# Patient Record
Sex: Male | Born: 1955
Health system: Southern US, Community
[De-identification: ages and names within clinical notes are randomized; demographics above are authoritative.]

## PROBLEM LIST (undated history)

## (undated) DIAGNOSIS — S66802A Unspecified injury of other specified muscles, fascia and tendons at wrist and hand level, left hand, initial encounter: Secondary | ICD-10-CM

## (undated) DIAGNOSIS — T7840XA Allergy, unspecified, initial encounter: Secondary | ICD-10-CM

## (undated) DIAGNOSIS — N419 Inflammatory disease of prostate, unspecified: Secondary | ICD-10-CM

## (undated) DIAGNOSIS — G5600 Carpal tunnel syndrome, unspecified upper limb: Secondary | ICD-10-CM

## (undated) DIAGNOSIS — E785 Hyperlipidemia, unspecified: Secondary | ICD-10-CM

## (undated) HISTORY — PX: POLYPECTOMY: SHX149

## (undated) HISTORY — PX: CARPAL TUNNEL RELEASE: SHX101

## (undated) HISTORY — DX: Hyperlipidemia, unspecified: E78.5

## (undated) HISTORY — DX: Carpal tunnel syndrome, unspecified upper limb: G56.00

## (undated) HISTORY — PX: COLONOSCOPY: SHX174

## (undated) HISTORY — DX: Allergy, unspecified, initial encounter: T78.40XA

## (undated) HISTORY — DX: Inflammatory disease of prostate, unspecified: N41.9

---

## 1999-07-27 ENCOUNTER — Encounter: Payer: Self-pay | Admitting: Family Medicine

## 1999-07-27 ENCOUNTER — Ambulatory Visit (HOSPITAL_COMMUNITY): Admission: RE | Admit: 1999-07-27 | Discharge: 1999-07-27 | Payer: Self-pay | Admitting: Family Medicine

## 2005-12-23 ENCOUNTER — Ambulatory Visit: Payer: Self-pay | Admitting: Internal Medicine

## 2005-12-28 ENCOUNTER — Encounter (INDEPENDENT_AMBULATORY_CARE_PROVIDER_SITE_OTHER): Payer: Self-pay | Admitting: *Deleted

## 2005-12-28 ENCOUNTER — Ambulatory Visit: Payer: Self-pay | Admitting: Internal Medicine

## 2008-04-15 ENCOUNTER — Ambulatory Visit: Payer: Self-pay | Admitting: Internal Medicine

## 2008-04-15 DIAGNOSIS — Z86010 Personal history of colonic polyps: Secondary | ICD-10-CM | POA: Insufficient documentation

## 2008-04-26 ENCOUNTER — Ambulatory Visit: Payer: Self-pay | Admitting: Internal Medicine

## 2008-04-26 LAB — CONVERTED CEMR LAB
ALT: 16 units/L (ref 0–53)
AST: 15 units/L (ref 0–37)
Albumin: 3.7 g/dL (ref 3.5–5.2)
Alkaline Phosphatase: 51 units/L (ref 39–117)
BUN: 11 mg/dL (ref 6–23)
Basophils Absolute: 0 10*3/uL (ref 0.0–0.1)
Basophils Relative: 0 % (ref 0.0–3.0)
Bilirubin Urine: NEGATIVE
Bilirubin, Direct: 0.1 mg/dL (ref 0.0–0.3)
Blood in Urine, dipstick: NEGATIVE
CO2: 32 meq/L (ref 19–32)
Calcium: 8.9 mg/dL (ref 8.4–10.5)
Chloride: 101 meq/L (ref 96–112)
Cholesterol: 149 mg/dL (ref 0–200)
Creatinine, Ser: 1 mg/dL (ref 0.4–1.5)
Eosinophils Absolute: 0.2 10*3/uL (ref 0.0–0.7)
Eosinophils Relative: 2.3 % (ref 0.0–5.0)
GFR calc Af Amer: 101 mL/min
GFR calc non Af Amer: 83 mL/min
Glucose, Bld: 78 mg/dL (ref 70–99)
Glucose, Urine, Semiquant: NEGATIVE
HCT: 41.8 % (ref 39.0–52.0)
HDL: 56.6 mg/dL (ref >39.0)
Hemoglobin: 14.2 g/dL (ref 13.0–17.0)
Ketones, urine, test strip: NEGATIVE
LDL Cholesterol: 82 mg/dL (ref 0–99)
Lymphocytes Relative: 12.2 % (ref 12.0–46.0)
MCHC: 34 g/dL (ref 30.0–36.0)
MCV: 94 fL (ref 78.0–100.0)
Monocytes Absolute: 1 10*3/uL (ref 0.1–1.0)
Monocytes Relative: 11.7 % (ref 3.0–12.0)
Neutro Abs: 6.2 10*3/uL (ref 1.4–7.7)
Neutrophils Relative %: 73.8 % (ref 43.0–77.0)
Nitrite: NEGATIVE
PSA: 0.41 ng/mL (ref 0.10–4.00)
Platelets: 202 10*3/uL (ref 150–400)
Potassium: 3.7 meq/L (ref 3.5–5.1)
Protein, U semiquant: NEGATIVE
RBC: 4.45 M/uL (ref 4.22–5.81)
RDW: 12.1 % (ref 11.5–14.6)
Sodium: 137 meq/L (ref 135–145)
Specific Gravity, Urine: 1.01
TSH: 0.97 microintl units/mL (ref 0.35–5.50)
Total Bilirubin: 0.7 mg/dL (ref 0.3–1.2)
Total CHOL/HDL Ratio: 2.6
Total Protein: 6.3 g/dL (ref 6.0–8.3)
Triglycerides: 51 mg/dL (ref 0–149)
Urobilinogen, UA: 0.2
VLDL: 10 mg/dL (ref 0–40)
WBC Urine, dipstick: NEGATIVE
WBC: 8.4 10*3/uL (ref 4.5–10.5)
pH: 7

## 2009-03-05 ENCOUNTER — Ambulatory Visit: Payer: Self-pay | Admitting: Internal Medicine

## 2009-03-05 DIAGNOSIS — H6120 Impacted cerumen, unspecified ear: Secondary | ICD-10-CM | POA: Insufficient documentation

## 2009-06-24 ENCOUNTER — Telehealth: Payer: Self-pay | Admitting: Internal Medicine

## 2009-06-26 ENCOUNTER — Telehealth: Payer: Self-pay | Admitting: Internal Medicine

## 2009-10-28 ENCOUNTER — Telehealth: Payer: Self-pay | Admitting: Internal Medicine

## 2010-02-19 ENCOUNTER — Ambulatory Visit: Payer: Self-pay | Admitting: Internal Medicine

## 2010-02-19 LAB — CONVERTED CEMR LAB
ALT: 13 units/L (ref 0–53)
AST: 17 units/L (ref 0–37)
Albumin: 3.9 g/dL (ref 3.5–5.2)
Alkaline Phosphatase: 53 units/L (ref 39–117)
BUN: 16 mg/dL (ref 6–23)
Basophils Absolute: 0 10*3/uL (ref 0.0–0.1)
Basophils Relative: 0.6 % (ref 0.0–3.0)
Bilirubin Urine: NEGATIVE
Bilirubin, Direct: 0.1 mg/dL (ref 0.0–0.3)
Blood in Urine, dipstick: NEGATIVE
CO2: 27 meq/L (ref 19–32)
Calcium: 9.3 mg/dL (ref 8.4–10.5)
Chloride: 104 meq/L (ref 96–112)
Cholesterol: 172 mg/dL (ref 0–200)
Creatinine, Ser: 1 mg/dL (ref 0.4–1.5)
Eosinophils Absolute: 0.1 10*3/uL (ref 0.0–0.7)
Eosinophils Relative: 2 % (ref 0.0–5.0)
GFR calc non Af Amer: 87.63 mL/min (ref >60)
Glucose, Bld: 98 mg/dL (ref 70–99)
Glucose, Urine, Semiquant: NEGATIVE
HCT: 44.9 % (ref 39.0–52.0)
HDL: 59 mg/dL (ref >39.00)
Hemoglobin: 15.2 g/dL (ref 13.0–17.0)
Ketones, urine, test strip: NEGATIVE
LDL Cholesterol: 99 mg/dL (ref 0–99)
Lymphocytes Relative: 22.5 % (ref 12.0–46.0)
Lymphs Abs: 1.3 10*3/uL (ref 0.7–4.0)
MCHC: 33.9 g/dL (ref 30.0–36.0)
MCV: 93.4 fL (ref 78.0–100.0)
Monocytes Absolute: 0.6 10*3/uL (ref 0.1–1.0)
Monocytes Relative: 9.8 % (ref 3.0–12.0)
Neutro Abs: 3.9 10*3/uL (ref 1.4–7.7)
Neutrophils Relative %: 65.1 % (ref 43.0–77.0)
Nitrite: NEGATIVE
PSA: 0.47 ng/mL (ref 0.10–4.00)
Platelets: 248 10*3/uL (ref 150.0–400.0)
Potassium: 4 meq/L (ref 3.5–5.1)
Protein, U semiquant: NEGATIVE
RBC: 4.81 M/uL (ref 4.22–5.81)
RDW: 13.7 % (ref 11.5–14.6)
Sodium: 138 meq/L (ref 135–145)
Specific Gravity, Urine: 1.02
TSH: 0.97 microintl units/mL (ref 0.35–5.50)
Total Bilirubin: 0.8 mg/dL (ref 0.3–1.2)
Total CHOL/HDL Ratio: 3
Total Protein: 6.5 g/dL (ref 6.0–8.3)
Triglycerides: 70 mg/dL (ref 0.0–149.0)
Urobilinogen, UA: 0.2
VLDL: 14 mg/dL (ref 0.0–40.0)
WBC Urine, dipstick: NEGATIVE
WBC: 6 10*3/uL (ref 4.5–10.5)
pH: 6

## 2010-02-23 ENCOUNTER — Ambulatory Visit: Payer: Self-pay | Admitting: Internal Medicine

## 2010-03-31 ENCOUNTER — Ambulatory Visit: Payer: Self-pay | Admitting: Internal Medicine

## 2010-06-09 NOTE — Progress Notes (Signed)
Summary: Pt called and gave the malaria vaccine info that was needed  Phone Note Call from Patient Call back at Home Phone [phone]   Caller: spouse-[name] Summary of Call: Pts wife called health dept and was told that the kind of malaria vaccine pt needs is Chloroquin. Pt weighs 165lbs and is traveling to [place] from March 10th thru the 14th 2011.  [facility] phone [phone] and fax [phone] Initial call taken by: Lucy Antigua,  June 26, 2009 9:05 AM    chlorquine 500 mg  #11 one weekly starting 2 weeks prior to travel    left msg on ans mach that med had been called in to pharm . KIK

## 2010-06-09 NOTE — Progress Notes (Signed)
Summary: REQ FOR MED  Phone Note Call from Patient   Caller: Spouse Alvino Chapel) [phone] Reason for Call: Talk to Doctor Summary of Call: Pts wife called to adv that her husband will be going out of the country ([place]) from March 10 - July 21, 2009.... Pt req that a RX for med: Chloroquine (med for prevention of Malaria) be sent [facility]  Phone # [phone]   /    Fax # [phone].  Pts wife stated that if there is another /  different prevention med for Malaria that Dr Kirtland Bouchard would recommend please let them know..... Pt / Pts wife can be reached at [phone].  Initial call taken by: Debbra Riding,  June 24, 2009 12:28 PM  Follow-up for Phone Call        have patient check with Public Health for malaria proprylaxis guidelines Follow-up by: Gordy Savers  MD,  June 24, 2009 1:01 PM  Additional Follow-up for Phone Call Additional follow up Details #1::        Spoke with pts wife Alvino Chapel).... she adv that she would talk to the Health Dept and call back to advise. Additional Follow-up by: Debbra Riding,  June 24, 2009 1:37 PM

## 2010-06-09 NOTE — Assessment & Plan Note (Signed)
Summary: ear wax build up/cjr   Vital Signs:  Patient profile:   55 year old male Weight:      167 pounds BP sitting:   120 / 70  (right arm) Cuff size:   regular  Vitals Entered By: Duard Brady LPN (March 31, 2010 3:21 PM) CC: c/o (L) ear plugged with wax Is Patient Diabetic? No   CC:  c/o (L) ear plugged with wax.  History of Present Illness: 55 year old patient complained of decrease in auditory acuity from the left ear.  He was noted to have a cerumen impaction, and the left canal irrigated until clear.  Denies any ear pain or drainage.  No fever or sinus symptoms  Allergies (verified): No Known Drug Allergies  Review of Systems       The patient complains of decreased hearing.    Physical Exam  Ears:  left canal was occluded with cerumen and irrigated   Impression & Recommendations:  Problem # 1:  CERUMEN IMPACTION, LEFT (ICD-380.4)  Patient Instructions: 1)  Please schedule a follow-up appointment in 1 year.   Orders Added: 1)  New Patient Level III [16109]

## 2010-06-09 NOTE — Assessment & Plan Note (Signed)
Summary: CPX AND FLU SHOT//SL[facility]   Vital Signs:  Patient profile:   55 year old male Height:      70 inches Weight:      166 pounds BMI:     23.90 Temp:     98.2 degrees F oral BP sitting:   110 / 72  (right arm) Cuff size:   regular  Vitals Entered By: Duard Brady LPN (February 23, 2010 1:33 PM) CC: cpx - doing well  Is Patient Diabetic? No Flu Vaccine Consent Questions     Do you have a history of severe allergic reactions to this vaccine? no    Any prior history of allergic reactions to egg and/or gelatin? no    Do you have a sensitivity to the preservative Thimersol? no    Do you have a past history of Guillan-Barre Syndrome? no    Do you currently have an acute febrile illness? no    Have you ever had a severe reaction to latex? no    Vaccine information given and explained to patient? yes    Are you currently pregnant? no    Lot Number:[id number]   Exp Date:11/07/2010   Site Given  Left Deltoid IM   CC:  cpx - doing well .  History of Present Illness: 55 year old patient who is seen today for a wellness exam.  He has a history of colonic polyps.  He is doing quite well without concerns or complaints  Allergies (verified): No Known Drug Allergies  Past History:  Past Medical History: Reviewed history from 04/15/2008 and no changes required. single admission for abdominal pain, and required upper endoscopy Colonic polyps, hx of  Past Surgical History: Reviewed history from 04/15/2008 and no changes required. unremarkable  Colonoscopy 2007  Family History: Reviewed history from 04/15/2008 and no changes required. father age 50 history of coronary artery disease, possibly valvular heart disease mother, age 51.  DJD, status post bilateral total knee replacement  one brother died of ALS, history of hypertension two sisters are well except for some mild obesity  Social History: Reviewed history from 04/15/2008 and no changes required. 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    Physical Exam  General:  Well-developed,well-nourished,in no acute distress; alert,appropriate and cooperative throughout examination Head:  Normocephalic and atraumatic without obvious abnormalities. No apparent alopecia or balding. Eyes:  No corneal or conjunctival inflammation noted. EOMI. Perrla. Funduscopic exam benign, without hemorrhages, exudates or papilledema. Vision grossly normal. Ears:  External ear exam shows no significant lesions or deformities.  Otoscopic examination reveals clear canals, tympanic membranes are intact bilaterally without bulging, retraction, inflammation or discharge. Hearing is grossly normal bilaterally. Nose:  External nasal examination shows no deformity or inflammation. Nasal mucosa are pink and moist without lesions or exudates. Mouth:  Oral mucosa and oropharynx without lesions or exudates.  Teeth in good repair. Neck:  No deformities, masses, or tenderness noted. Chest Wall:  No deformities, masses, tenderness or gynecomastia noted. Breasts:  No masses or gynecomastia noted Lungs:  Normal respiratory effort, chest expands symmetrically. Lungs are clear to auscultation, no crackles or wheezes. Heart:  Normal rate and regular rhythm. S1 and S2 normal without gallop, murmur, click, rub or other extra sounds. Abdomen:  Bowel sounds positive,abdomen soft and non-tender without  masses, organomegaly or hernias noted. Rectal:  No external abnormalities noted. Normal sphincter tone. No rectal masses or tenderness. Genitalia:  Testes bilaterally descended without nodularity, tenderness or masses. No scrotal masses or lesions. No penis lesions or urethral discharge. Prostate:  Prostate gland firm and smooth, no enlargement, nodularity, tenderness, mass, asymmetry or induration. Msk:  No deformity or scoliosis noted of thoracic or lumbar spine.   Pulses:  R and L carotid,radial,femoral,dorsalis pedis and posterior tibial pulses are full and equal bilaterally Extremities:  No clubbing, cyanosis, edema, or deformity noted with normal full range of motion of all joints.   Neurologic:  No cranial nerve deficits noted. Station and gait are normal. Plantar reflexes are down-going bilaterally. DTRs are symmetrical throughout. Sensory, motor and coordinative functions appear intact. Skin:  Intact without suspicious lesions or rashes Cervical Nodes:  No lymphadenopathy noted Axillary Nodes:  No palpable lymphadenopathy Inguinal Nodes:  No significant adenopathy Psych:  Cognition and judgment appear intact. Alert and cooperative with normal attention span and concentration. No apparent delusions, illusions, hallucinations   Impression & Recommendations:  Problem # 1:  aPREVENTIVE HEALTH CARE (ICD-V70.0)  Other Orders: Admin 1st Vaccine (04540) Flu Vaccine 7yrs + (98119)  Patient Instructions: 1)  Please schedule a follow-up appointment in 1 year. 2)  Limit your Sodium (Salt). 3)  It is important that you exercise regularly at least 20 minutes 5 times a week. If you develop chest pain, have severe difficulty breathing, or feel very tired , stop exercising immediately and seek medical attention.   Orders Added: 1)  Admin 1st Vaccine [90471] 2)  Flu Vaccine 45yrs + [90658] 3)  Est. Patient 40-64 years [14782]

## 2010-06-09 NOTE — Progress Notes (Signed)
Summary: Chloroquine to [facility]  Phone Note Call from Patient Call back at [facility] Phone [phone] Call back at [phone] [name]'s cell   Caller: Spouse-[name] Summary of Call: Pt is going back to [place] again and needs refill of Chloroquine again. He'll be in [place] from 11/08/09 to 11/18/09. Please call in to [facility] [phone].    Pls notify pts wife when this has been done.  Initial call taken by: Lucy Antigua,  October 28, 2009 10:33 AM  Follow-up for Phone Call        med list updated , rx escribed , wife aware. KIK Follow-up by: Duard Brady LPN,  October 28, 2009 12:36 PM    New/Updated Medications: CHLOROQUINE PHOSPHATE 500 MG TABS (CHLOROQUINE PHOSPHATE) 1 by mouth weekly starting 2 weeks before departing for [place] Prescriptions: CHLOROQUINE PHOSPHATE 500 MG TABS (CHLOROQUINE PHOSPHATE) 1 by mouth weekly starting 2 weeks before departing for [place]  #10 x 0   Entered by:   Duard Brady LPN   Authorized by:   Gordy Savers  MD   Signed by:   Duard Brady LPN on 27/25/3664   Method used:   Electronically to        [facility]* (retail)       [address] [place], [place]  [address]       Ph: [phone] or [phone]       Fax: [phone]   RxID:   [id number]  Chloroquine 500 mg, number 10;  take one weekly, starting two weeks prior to departing for [place]

## 2010-11-23 ENCOUNTER — Encounter: Payer: Self-pay | Admitting: Internal Medicine

## 2010-11-23 ENCOUNTER — Ambulatory Visit (INDEPENDENT_AMBULATORY_CARE_PROVIDER_SITE_OTHER): Payer: BC Managed Care – PPO | Admitting: Internal Medicine

## 2010-11-23 DIAGNOSIS — Z860100 Personal history of colon polyps, unspecified: Secondary | ICD-10-CM

## 2010-11-23 DIAGNOSIS — H6120 Impacted cerumen, unspecified ear: Secondary | ICD-10-CM

## 2010-11-23 NOTE — Progress Notes (Signed)
  Subjective:    Patient ID: Ronald Gardner, male    DOB: 06/21/1955, 55 y.o.   MRN: [id number]  HPI55 year old patient who is seen today for followup. He complains of some itching and diminished auditory acuity on the left. He has required irrigation for a left cerumen impaction in the past. He has a history of colonic polyps and is due for followup colonoscopy. He had his initial colonoscopy at age 55. No change in his bowel habits    Review of Systems  HENT: Positive for hearing loss and ear pain.        Objective:   Physical Exam  Constitutional: He appears well-developed and well-nourished. No distress.       Blood pressure low normal  HENT:       Cerumen impaction noted on the left. Right canal was clear          Assessment & Plan:    Left cerumen  impaction. The left canal will be irrigated until clear Colonic polyps. We'll schedule followup colonoscopy. The patient wishes to check his schedule and make his own appointment

## 2010-11-23 NOTE — Patient Instructions (Signed)
Schedule your colonoscopy to help detect colon cancer.    Return in one year for follow-up   

## 2011-02-17 ENCOUNTER — Other Ambulatory Visit (INDEPENDENT_AMBULATORY_CARE_PROVIDER_SITE_OTHER): Payer: BC Managed Care – PPO

## 2011-02-17 DIAGNOSIS — Z0000 Encounter for general adult medical examination without abnormal findings: Secondary | ICD-10-CM

## 2011-02-17 LAB — CBC WITH DIFFERENTIAL/PLATELET
Basophils Absolute: 0 10*3/uL (ref 0.0–0.1)
Basophils Relative: 0.5 % (ref 0.0–3.0)
Eosinophils Absolute: 0.2 10*3/uL (ref 0.0–0.7)
Eosinophils Relative: 2.8 % (ref 0.0–5.0)
HCT: 47.3 % (ref 39.0–52.0)
Hemoglobin: 15.8 g/dL (ref 13.0–17.0)
Lymphocytes Relative: 24.8 % (ref 12.0–46.0)
Lymphs Abs: 1.6 10*3/uL (ref 0.7–4.0)
MCHC: 33.5 g/dL (ref 30.0–36.0)
MCV: 94 fl (ref 78.0–100.0)
Monocytes Absolute: 0.5 10*3/uL (ref 0.1–1.0)
Monocytes Relative: 8 % (ref 3.0–12.0)
Neutro Abs: 4.1 10*3/uL (ref 1.4–7.7)
Neutrophils Relative %: 63.9 % (ref 43.0–77.0)
Platelets: 244 10*3/uL (ref 150.0–400.0)
RBC: 5.03 Mil/uL (ref 4.22–5.81)
RDW: 13 % (ref 11.5–14.6)
WBC: 6.4 10*3/uL (ref 4.5–10.5)

## 2011-02-17 LAB — BASIC METABOLIC PANEL
BUN: 14 mg/dL (ref 6–23)
CO2: 30 mEq/L (ref 19–32)
Calcium: 9.2 mg/dL (ref 8.4–10.5)
Chloride: 103 mEq/L (ref 96–112)
Creatinine, Ser: 1 mg/dL (ref 0.4–1.5)
GFR: 84.23 mL/min (ref >60.00)
Glucose, Bld: 99 mg/dL (ref 70–99)
Potassium: 4.4 mEq/L (ref 3.5–5.1)
Sodium: 140 mEq/L (ref 135–145)

## 2011-02-17 LAB — POCT URINALYSIS DIPSTICK
Protein, UA: NEGATIVE
Spec Grav, UA: 1.015
Urobilinogen, UA: 0.2

## 2011-02-17 LAB — HEPATIC FUNCTION PANEL
ALT: 19 U/L (ref 0–53)
AST: 18 U/L (ref 0–37)
Albumin: 4.4 g/dL (ref 3.5–5.2)
Alkaline Phosphatase: 60 U/L (ref 39–117)
Bilirubin, Direct: 0.1 mg/dL (ref 0.0–0.3)
Total Bilirubin: 0.8 mg/dL (ref 0.3–1.2)
Total Protein: 7 g/dL (ref 6.0–8.3)

## 2011-02-17 LAB — PSA: PSA: 0.52 ng/mL (ref 0.10–4.00)

## 2011-02-17 LAB — LIPID PANEL
Cholesterol: 213 mg/dL — ABNORMAL HIGH (ref 0–200)
HDL: 63.7 mg/dL (ref >39.00)
Total CHOL/HDL Ratio: 3
Triglycerides: 68 mg/dL (ref 0.0–149.0)
VLDL: 13.6 mg/dL (ref 0.0–40.0)

## 2011-02-17 LAB — LDL CHOLESTEROL, DIRECT: Direct LDL: 126.5 mg/dL

## 2011-03-29 ENCOUNTER — Encounter: Payer: Self-pay | Admitting: Internal Medicine

## 2011-03-29 ENCOUNTER — Ambulatory Visit (INDEPENDENT_AMBULATORY_CARE_PROVIDER_SITE_OTHER): Payer: BC Managed Care – PPO | Admitting: Internal Medicine

## 2011-03-29 VITALS — BP 112/80 | HR 70 | Temp 98.1°F | Resp 16 | Ht 70.0 in | Wt 164.0 lb

## 2011-03-29 DIAGNOSIS — Z860100 Personal history of colon polyps, unspecified: Secondary | ICD-10-CM

## 2011-03-29 MED ORDER — VARDENAFIL HCL 20 MG PO TABS: 20.0000 mg | ORAL_TABLET | ORAL | Status: DC | PRN

## 2011-03-29 NOTE — Progress Notes (Signed)
Subjective:    Patient ID: Ronald Gardner, male    DOB: 1955-07-30, 55 y.o.   MRN: [id number]  HPI  55 year old patient seen today for a wellness exam. He enjoys excellent health and takes no chronic medications. He does have a history of colonic polyps and is scheduled for followup colonoscopy soon.   Allergies (verified):  No Known Drug Allergies  Past History:  Past Medical History:  Reviewed history from 04/15/2008 and no changes required.  single admission for abdominal pain, and required upper endoscopy  Colonic polyps, hx of  Past Surgical History:  Reviewed history from 04/15/2008 and no changes required.  unremarkable  Colonoscopy 2007  Family History:  Reviewed history from 04/15/2008 and no changes required.  father age 71 history of coronary artery disease, possibly valvular heart disease  mother, age 36. DJD, status post bilateral total knee replacement  one brother died of ALS, history of hypertension  two sisters are well except for some mild obesity  Social History:  Reviewed history from 04/15/2008 and no changes required.  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  History   Social History  . Marital Status: Single    Spouse Name: N/A    Number of Children: N/A  . Years of Education: N/A   Occupational History  . Not on file.   Social History Main Topics  . Smoking status: Never Smoker   . Smokeless tobacco: Never Used  . Alcohol Use: Yes  . Drug Use: No  . Sexually Active: Not on file   Other Topics Concern  . Not on file   Social History Narrative  . No narrative on file    No past surgical history on file.  Family History  Problem Relation Age of Onset  . Heart disease Father     No Known Allergies  No current outpatient prescriptions on file prior to visit.    BP 112/80  Pulse 70  Temp(Src) 98.1 F (36.7 C) (Oral)  Resp 16  Ht 5\' 10"  (1.778 m)  Wt 164 lb (74.39  kg)  BMI 23.53 kg/m2  SpO2 98%     Review of Systems  Constitutional: Negative for fever, chills, activity change, appetite change and fatigue.  HENT: Negative for hearing loss, ear pain, congestion, rhinorrhea, sneezing, mouth sores, trouble swallowing, neck pain, neck stiffness, dental problem, voice change, sinus pressure and tinnitus.   Eyes: Negative for photophobia, pain, redness and visual disturbance.  Respiratory: Negative for apnea, cough, choking, chest tightness, shortness of breath and wheezing.   Cardiovascular: Negative for chest pain, palpitations and leg swelling.  Gastrointestinal: Negative for nausea, vomiting, abdominal pain, diarrhea, constipation, blood in stool, abdominal distention, anal bleeding and rectal pain.  Genitourinary: Negative for dysuria, urgency, frequency, hematuria, flank pain, decreased urine volume, discharge, penile swelling, scrotal swelling, difficulty urinating, genital sores and testicular pain.  Musculoskeletal: Negative for myalgias, back pain, joint swelling, arthralgias and gait problem.  Skin: Negative for color change, rash and wound.  Neurological: Negative for dizziness, tremors, seizures, syncope, facial asymmetry, speech difficulty, weakness, light-headedness, numbness and headaches.  Hematological: Negative for adenopathy. Does not bruise/bleed easily.  Psychiatric/Behavioral: Negative for suicidal ideas, hallucinations, behavioral problems, confusion, sleep disturbance, self-injury, dysphoric mood, decreased concentration and agitation. The patient is not nervous/anxious.        Objective:   Physical Exam  Constitutional: He appears well-developed and well-nourished.  HENT:  Head: Normocephalic and  atraumatic.  Right Ear: External ear normal.  Left Ear: External ear normal.  Nose: Nose normal.  Mouth/Throat: Oropharynx is clear and moist.  Eyes: Conjunctivae and EOM are normal. Pupils are equal, round, and reactive to light. No  scleral icterus.  Neck: Normal range of motion. Neck supple. No JVD present. No thyromegaly present.  Cardiovascular: Regular rhythm, normal heart sounds and intact distal pulses.  Exam reveals no gallop and no friction rub.   No murmur heard. Pulmonary/Chest: Effort normal and breath sounds normal. He exhibits no tenderness.  Abdominal: Soft. Bowel sounds are normal. He exhibits no distension and no mass. There is no tenderness.  Genitourinary: Prostate normal and penis normal.  Musculoskeletal: Normal range of motion. He exhibits no edema and no tenderness.  Lymphadenopathy:    He has no cervical adenopathy.  Neurological: He is alert. He has normal reflexes. No cranial nerve deficit. Coordination normal.  Skin: Skin is warm and dry. No rash noted.  Psychiatric: He has a normal mood and affect. His behavior is normal.          Assessment & Plan:     Preventive Health Exam

## 2011-03-29 NOTE — Patient Instructions (Addendum)
It is important that you exercise regularly, at least 20 minutes 3 to 4 times per week.  If you develop chest pain or shortness of breath seek  medical attention.  Return in one year for follow-up   Schedule your colonoscopy to help detect colon cancer. 

## 2011-09-14 ENCOUNTER — Encounter: Payer: Self-pay | Admitting: Internal Medicine

## 2011-09-14 ENCOUNTER — Ambulatory Visit (INDEPENDENT_AMBULATORY_CARE_PROVIDER_SITE_OTHER): Payer: BC Managed Care – PPO | Admitting: Internal Medicine

## 2011-09-14 VITALS — BP 120/82 | Temp 97.9°F | Wt 167.0 lb

## 2011-09-14 DIAGNOSIS — N410 Acute prostatitis: Secondary | ICD-10-CM

## 2011-09-14 MED ORDER — CIPROFLOXACIN HCL 500 MG PO TABS: 500.0000 mg | ORAL_TABLET | Freq: Two times a day (BID) | ORAL | Status: AC

## 2011-09-14 NOTE — Patient Instructions (Addendum)
Take your antibiotic as prescribed until ALL of it is gone, but stop if you develop a rash, swelling, or any side effects of the medication.  Contact our office as soon as possible if  there are side effects of the medication.  Call or return to clinic prn if these symptoms worsen or fail to improve as anticipated. Prostatitis Prostatitis is an inflammation (the body's way of reacting to injury and/or infection) of the prostate gland. The prostate gland is a male organ. The gland is about the size and shape of a walnut. The prostate is located just below the bladder. It produces semen, which is a fluid that helps nourish and transport sperm. Prostatitis is the most common urinary tract problem in men younger than age 53. There are 4 categories of prostatitis:  I - Acute bacterial prostatitis.   II - Chronic bacterial prostatitis.   III - Chronic prostatitis and chronic pelvic pain syndrome (CPPS).   Inflammatory.   Non inflammatory.   IV - Asymptomatic inflammatory prostatitis.  Acute and chronic bacterial prostatitis are problems with bacterial infections of the prostate. "Acute" infection is usually a one-time problem. "Chronic" bacterial prostatitis is a condition with recurrent infection. It is usually caused by the same germ(bacteria). CPPS has symptoms similar to prostate infection. However, no infection is actually found. This condition can cause problems of ongoing pain. Currently, it cannot be cured. Treatments are available and aimed at symptom control.   Asymptomatic inflammatory prostatitis has no symptoms. It is a condition where infection-fighting cells are found by chance in the urine. The diagnosis is made most often during an exam for other conditions. Other conditions could be infertility or a high level of PSA (prostate-specific antigen) in the blood. SYMPTOMS   Symptoms can vary depending upon the type of prostatitis that exists. There can also be overlap in symptoms.  This can make diagnosis difficult. Symptoms: For Acute bacterial prostatitis  Painful urination.   Fever or chills.   Muscle or joint pains.   Low back pain.   Low abdominal pain.   Inability to empty bladder completely.   Sudden urges to urinate.   Frequent urination during the day.   Difficulty starting urine stream.   Need to urinate several times at night (nocturia).   Weak urine stream.   Urethral (tube that carries urine from the bladder out of the body) discharge and dribbling after urination.  For Chronic bacterial prostatitis  Rectal pain.   Pain in the testicles, penis, or tip of the penis.   Pain in the space between the anus and scrotum (perineum).   Low back pain.   Low abdominal pain.   Problems with sexual function.   Painful ejaculation.   Bloody semen.   Inability to empty bladder completely.   Painful urination.   Sudden urges to urinate.   Frequent urination during the day.   Difficulty starting urine stream.   Need to urinate several times at night (nocturia).   Weak urine stream.   Dribbling after urination.   Urethral discharge.  For Chronic prostatitis and chronic pelvic pain syndrome (CPPS) Symptoms are the same as those for chronic bacterial prostatitis. Problems with sexual function are often the reason for seeking care. This important problem should be discussed with your caregiver. For Asymptomatic inflammatory prostatitis As noted above, there are no symptoms with this condition. DIAGNOSIS    Your caregiver may perform a rectal exam. This exam is to determine if the prostate is swollen  and tender.   Sometimes blood work is performed. This is done to see if your white blood cell count is elevated. The Prostate Specific Antigen (PSA) is also measured. PSA is a blood test that can help detect early prostate cancer.   A urinalysis is done to find out what type of infection is present if this is a suspected cause. An  additional urinalysis may be done after a digital rectal exam. This is to see if white blood cells are pushed out of the prostate and into the urine. A low-grade infection of the prostate may not be found on the first urinalysis.  In more difficult cases, your caregiver may advise other tests. Tests could include:  Urodynamics -- Tests the function of the bladder and the organs involved in triggering and controlling normal urination.   Urine flow rate.   Cystoscopy -- In this procedure, a thin, telescope-like tube with a light and tiny camera attached (cystoscope) is inserted into the bladder through the urethra. This allows the caregiver to see the inside of the urethra and bladder.   Electromyography -- This procedure tests how the muscles and nerves of the bladder work. It is focused on the muscles that control the anus and pelvic floor. These are the muscles between the anus and scrotum.  In people who show no signs of infection, certain uncommon infections might be causing constant or recurrent symptoms. These uncommon infections are difficult to detect. More work in medicine may help find solutions to these problems. TREATMENT   Antibiotics are used to treat infections caused by germs. If the infection is not treated and becomes long lasting (chronic), it may become a lower grade infection with minor, continual problems. Without treatment, the prostate may develop a boil or furuncle (abscess). This may require surgical treatment. For those with chronic prostatitis and CPPS, it is important to work closely with your primary caregiver and urologist. For some, the medicines that are used to treat a non-cancerous, enlarged prostate (benign prostatic hypertrophy) may be helpful. Referrals to specialists other than urologists may be necessary. In rare cases when all treatments have been inadequate for pain control, an operation to remove the prostate may be recommended. This is very rare and before  this is considered thorough discussion with your urologist is highly recommended.   In cases of secondary to chronic non-bacterial prostatitis, a good relationship with your urologist or primary caregiver is essential because it is often a recurrent prolonged condition that requires a good understanding of the causes and a commitment to therapy aimed at controlling your symptoms. HOME CARE INSTRUCTIONS    Hot sitz baths for 20 minutes, 4 times per day, may help relieve pain.   Non-prescription pain killers may be used as your caregiver recommends if you have no allergies to them. Some illnesses or conditions prevent use of non-prescription drugs. If unsure, check with your caregiver. Take all medications as directed. Take the antibiotics for the prescribed length of time, even if you are feeling better.  SEEK MEDICAL CARE IF:    You have any worsening of the symptoms that originally brought you to your caregiver.   You have an oral temperature above 102 F (38.9 C).   You experience any side effects from medications prescribed.  SEEK IMMEDIATE MEDICAL CARE IF:    You have an oral temperature above 102 F (38.9 C), not controlled by medicine.   You have pain not relieved with medications.   You develop nausea, vomiting, lightheadedness,  or have a fainting episode.   You are unable to urinate.   You pass bloody urine or clots.  Document Released: 04/23/2000 Document Revised: 04/15/2011 Document Reviewed: 03/29/2011 [facility] Patient Information 2012 [place], [place].

## 2011-09-14 NOTE — Progress Notes (Signed)
  Subjective:    Patient ID: Ronald Gardner, male    DOB: [birth year], 56 y.o.   MRN: [id number]  HPI  56 year old patient who presents with a two-week history of worsening perineal pain. No fever. Prior to the onset of symptoms he began jogging again but has maintained his usual degree of regular exercise. No prior history of prostatitis   Review of Systems  Genitourinary: Negative for dysuria, frequency, discharge, scrotal swelling, difficulty urinating and testicular pain.       Objective:   Physical Exam  Genitourinary: Rectum normal and penis normal.       Prostate is slightly enlarged and boggy and tender. Palpation of prostate reproduces his pain          Assessment & Plan:  Acute prostatitis. We'll treat with Cipro for 30 days

## 2011-10-05 ENCOUNTER — Telehealth: Payer: Self-pay | Admitting: Internal Medicine

## 2011-10-05 DIAGNOSIS — Z1211 Encounter for screening for malignant neoplasm of colon: Secondary | ICD-10-CM

## 2011-10-05 MED ORDER — SULFAMETHOXAZOLE-TRIMETHOPRIM 800-160 MG PO TABS: 1.0000 | ORAL_TABLET | Freq: Two times a day (BID) | ORAL | Status: AC

## 2011-10-05 NOTE — Telephone Encounter (Signed)
Schedule colonoscopy;    please call in a new prescription for generic Septra DS  #60 to take one twice daily

## 2011-10-05 NOTE — Telephone Encounter (Signed)
Ronald Gardner called and said that abx is not working. Ronald Gardner said that he will continue taking the abx, but just want Dr Amador Cunas to be made aware.    Also Ronald Gardner said that he discussed getting a colonoscopy done and needs to know what Ronald Gardner needs to do to get one scheduled.

## 2011-10-05 NOTE — Telephone Encounter (Signed)
Pt with pt- informed of new med and GI referral - rx to [facility]

## 2011-10-18 ENCOUNTER — Encounter: Payer: Self-pay | Admitting: Internal Medicine

## 2011-10-20 ENCOUNTER — Telehealth: Payer: Self-pay | Admitting: Internal Medicine

## 2011-10-20 DIAGNOSIS — N410 Acute prostatitis: Secondary | ICD-10-CM

## 2011-10-20 NOTE — Telephone Encounter (Signed)
Caller: Ronald Gardner/Patient; Phone Number: [phone]; Message from caller: Patient is calling in regards to his medications that he was put on.  He is not feeling a positive impact and wanting to know what can be done.  Please call back at [phone]

## 2011-10-20 NOTE — Telephone Encounter (Signed)
Please advise - last seen 09/2011 prostatitis

## 2011-10-21 NOTE — Telephone Encounter (Signed)
Please call patient and refer to urology as soon as possible

## 2011-10-21 NOTE — Telephone Encounter (Signed)
Pt aware.

## 2011-10-25 ENCOUNTER — Telehealth: Payer: Self-pay | Admitting: Internal Medicine

## 2011-10-25 NOTE — Telephone Encounter (Signed)
Disregard

## 2011-12-14 ENCOUNTER — Ambulatory Visit (AMBULATORY_SURGERY_CENTER): Payer: BC Managed Care – PPO | Admitting: *Deleted

## 2011-12-14 ENCOUNTER — Encounter: Payer: Self-pay | Admitting: Internal Medicine

## 2011-12-14 VITALS — Ht 70.5 in | Wt 156.5 lb

## 2011-12-14 DIAGNOSIS — Z860100 Personal history of colon polyps, unspecified: Secondary | ICD-10-CM

## 2011-12-14 DIAGNOSIS — Z1211 Encounter for screening for malignant neoplasm of colon: Secondary | ICD-10-CM

## 2011-12-14 MED ORDER — MOVIPREP 100 G PO SOLR: ORAL | Status: DC

## 2012-01-04 ENCOUNTER — Ambulatory Visit (AMBULATORY_SURGERY_CENTER): Payer: BC Managed Care – PPO | Admitting: Internal Medicine

## 2012-01-04 ENCOUNTER — Encounter: Payer: Self-pay | Admitting: Internal Medicine

## 2012-01-04 VITALS — BP 110/73 | HR 66 | Temp 97.8°F | Resp 13 | Ht 70.0 in | Wt 156.0 lb

## 2012-01-04 DIAGNOSIS — D126 Benign neoplasm of colon, unspecified: Secondary | ICD-10-CM

## 2012-01-04 DIAGNOSIS — Z1211 Encounter for screening for malignant neoplasm of colon: Secondary | ICD-10-CM

## 2012-01-04 DIAGNOSIS — Z86010 Personal history of colonic polyps: Secondary | ICD-10-CM

## 2012-01-04 MED ORDER — SODIUM CHLORIDE 0.9 % IV SOLN: 500.0000 mL | INTRAVENOUS | Status: DC

## 2012-01-04 NOTE — Op Note (Signed)
[facility] [address] [place], [address]   COLONOSCOPY PROCEDURE REPORT  PATIENT: Ronald Gardner, Ronald Gardner.  MR#: [id number] BIRTHDATE: 03/02/1956 , 56  yrs. old GENDER: Male ENDOSCOPIST: Roxy Cedar, MD REFERRED [id number] Program Recall PROCEDURE DATE:  01/04/2012 PROCEDURE:   Colonoscopy with snare polypectomy  x 1 ASA CLASS:   Class II INDICATIONS:high risk patient with personal history of colonic polyps.   Index exam 12-2005 w/ TA MEDICATIONS: MAC sedation, administered by CRNA and propofol (Diprivan) 200mg  IV  DESCRIPTION OF PROCEDURE:   After the risks benefits and alternatives of the procedure were thoroughly explained, informed consent was obtained.  A digital rectal exam revealed no abnormalities of the rectum.   The LB [id number]  endoscope was introduced through the anus and advanced to the cecum, which was identified by both the appendix and ileocecal valve. No adverse events experienced.   The quality of the prep was good, using MoviPrep  The instrument was then slowly withdrawn as the colon was fully examined.      COLON FINDINGS: A diminutive polyp was found in the sigmoid colon. A polypectomy was performed with a cold snare.  The resection was complete and the polyp tissue was completely retrieved.   The colon mucosa was otherwise normal.  Retroflexed views revealed no abnormalities. The time to cecum=2 minutes 17 seconds  Withdrawal time=12 minutes 16 seconds.  The scope was withdrawn and the procedure completed.  COMPLICATIONS: There were no complications.  ENDOSCOPIC IMPRESSION: 1.   Diminutive polyp was found in the sigmoid colon; polypectomy was performed with a cold snare 2.   The colon mucosa was otherwise normal  RECOMMENDATIONS: 1. Follow up colonoscopy in 5 years  eSigned:  Roxy Cedar, MD 01/04/2012 9:54 AM   cc: Gordy Savers, MD and The Patient

## 2012-01-04 NOTE — Progress Notes (Signed)
Patient did not experience any of the following events: a burn prior to discharge; a fall within the facility; wrong site/side/patient/procedure/implant event; or a hospital transfer or hospital admission upon discharge from the facility. (G8907) Patient did not have preoperative order for IV antibiotic SSI prophylaxis. (G8918)  

## 2012-01-04 NOTE — Patient Instructions (Addendum)
Handouts were given to your care partner on polyps.  You may resume your prior medications today.  Please call if any questions or concerns.     YOU HAD AN ENDOSCOPIC PROCEDURE TODAY AT THE [facility]: Refer to the procedure report that was given to you for any specific questions about what was found during the examination.  If the procedure report does not answer your questions, please call your gastroenterologist to clarify.  If you requested that your care partner not be given the details of your procedure findings, then the procedure report has been included in a sealed envelope for you to review at your convenience later.  YOU SHOULD EXPECT: Some feelings of bloating in the abdomen. Passage of more gas than usual.  Walking can help get rid of the air that was put into your GI tract during the procedure and reduce the bloating. If you had a lower endoscopy (such as a colonoscopy or flexible sigmoidoscopy) you may notice spotting of blood in your stool or on the toilet paper. If you underwent a bowel prep for your procedure, then you may not have a normal bowel movement for a few days.  DIET: Your first meal following the procedure should be a light meal and then it is ok to progress to your normal diet.  A half-sandwich or bowl of soup is an example of a good first meal.  Heavy or fried foods are harder to digest and may make you feel nauseous or bloated.  Likewise meals heavy in dairy and vegetables can cause extra gas to form and this can also increase the bloating.  Drink plenty of fluids but you should avoid alcoholic beverages for 24 hours.  ACTIVITY: Your care partner should take you home directly after the procedure.  You should plan to take it easy, moving slowly for the rest of the day.  You can resume normal activity the day after the procedure however you should NOT DRIVE or use heavy machinery for 24 hours (because of the sedation medicines used during the test).    SYMPTOMS  TO REPORT IMMEDIATELY: A gastroenterologist can be reached at any hour.  During normal business hours, 8:30 AM to 5:00 PM Monday through Friday, call [phone].  After hours and on weekends, please call the GI answering service at [phone] who will take a message and have the physician on call contact you.   Following lower endoscopy (colonoscopy or flexible sigmoidoscopy):  Excessive amounts of blood in the stool  Significant tenderness or worsening of abdominal pains  Swelling of the abdomen that is new, acute  Fever of 100F or higher    FOLLOW UP: If any biopsies were taken you will be contacted by phone or by letter within the next 1-3 weeks.  Call your gastroenterologist if you have not heard about the biopsies in 3 weeks.  Our staff will call the home number listed on your records the next business day following your procedure to check on you and address any questions or concerns that you may have at that time regarding the information given to you following your procedure. This is a courtesy call and so if there is no answer at the home number and we have not heard from you through the emergency physician on call, we will assume that you have returned to your regular daily activities without incident.  SIGNATURES/CONFIDENTIALITY: You and/or your care partner have signed paperwork which will be entered into your electronic medical record.    These signatures attest to the fact that that the information above on your After Visit Summary has been reviewed and is understood.  Full responsibility of the confidentiality of this discharge information lies with you and/or your care-partner.  

## 2012-01-04 NOTE — Progress Notes (Signed)
No complaints noted in the recovery room. Maw   

## 2012-01-05 ENCOUNTER — Telehealth: Payer: Self-pay | Admitting: *Deleted

## 2012-01-05 NOTE — Telephone Encounter (Signed)
  Follow up Call-  Call back number 01/04/2012  Post procedure Call Back phone  # [phone]  Permission to leave phone message Yes     Patient questions:  Do you have a fever, pain , or abdominal swelling? no Pain Score  0 *  Have you tolerated food without any problems? yes  Have you been able to return to your normal activities? yes  Do you have any questions about your discharge instructions: Diet   no Medications  no Follow up visit  no  Do you have questions or concerns about your Care? no  Actions: * If pain score is 4 or above: No action needed, pain <4.  Spoke with wife,patient in shower,"doing great".

## 2012-01-11 ENCOUNTER — Encounter: Payer: Self-pay | Admitting: Internal Medicine

## 2012-03-14 ENCOUNTER — Other Ambulatory Visit (INDEPENDENT_AMBULATORY_CARE_PROVIDER_SITE_OTHER): Payer: BC Managed Care – PPO

## 2012-03-14 DIAGNOSIS — Z0000 Encounter for general adult medical examination without abnormal findings: Secondary | ICD-10-CM

## 2012-03-14 LAB — HEPATIC FUNCTION PANEL
ALT: 17 U/L (ref 0–53)
Albumin: 4.2 g/dL (ref 3.5–5.2)
Alkaline Phosphatase: 47 U/L (ref 39–117)
Total Protein: 6.7 g/dL (ref 6.0–8.3)

## 2012-03-14 LAB — CBC WITH DIFFERENTIAL/PLATELET
Basophils Absolute: 0 10*3/uL (ref 0.0–0.1)
HCT: 45.5 % (ref 39.0–52.0)
Lymphs Abs: 1.4 10*3/uL (ref 0.7–4.0)
MCHC: 32.1 g/dL (ref 30.0–36.0)
MCV: 94.3 fl (ref 78.0–100.0)
Monocytes Absolute: 0.5 10*3/uL (ref 0.1–1.0)
Platelets: 236 10*3/uL (ref 150.0–400.0)
RDW: 13.3 % (ref 11.5–14.6)

## 2012-03-14 LAB — LIPID PANEL
Cholesterol: 188 mg/dL (ref 0–200)
Total CHOL/HDL Ratio: 3
Triglycerides: 57 mg/dL (ref 0.0–149.0)

## 2012-03-14 LAB — POCT URINALYSIS DIPSTICK
Blood, UA: NEGATIVE
Leukocytes, UA: NEGATIVE
Nitrite, UA: NEGATIVE
Protein, UA: NEGATIVE
pH, UA: 7.5

## 2012-03-14 LAB — BASIC METABOLIC PANEL
BUN: 16 mg/dL (ref 6–23)
CO2: 30 mEq/L (ref 19–32)
Chloride: 99 mEq/L (ref 96–112)
Glucose, Bld: 81 mg/dL (ref 70–99)
Potassium: 4 mEq/L (ref 3.5–5.1)

## 2012-03-17 ENCOUNTER — Other Ambulatory Visit: Payer: BC Managed Care – PPO

## 2012-03-21 ENCOUNTER — Encounter: Payer: Self-pay | Admitting: Internal Medicine

## 2012-03-21 ENCOUNTER — Ambulatory Visit (INDEPENDENT_AMBULATORY_CARE_PROVIDER_SITE_OTHER): Payer: BC Managed Care – PPO | Admitting: Internal Medicine

## 2012-03-21 VITALS — BP 122/80 | HR 78 | Temp 98.0°F | Resp 16 | Ht 70.0 in | Wt 156.0 lb

## 2012-03-21 DIAGNOSIS — Z0000 Encounter for general adult medical examination without abnormal findings: Secondary | ICD-10-CM

## 2012-03-21 MED ORDER — TAMSULOSIN HCL 0.4 MG PO CAPS: 0.4000 mg | ORAL_CAPSULE | Freq: Every day | ORAL | Status: DC

## 2012-03-21 NOTE — Patient Instructions (Addendum)
It is important that you exercise regularly, at least 20 minutes 3 to 4 times per week.  If you develop chest pain or shortness of breath seek  medical attention.  Return in one year for follow-up   

## 2012-03-21 NOTE — Progress Notes (Signed)
Patient ID: Ronald Gardner, male   DOB: Feb 18, 1956, 56 y.o.   MRN: [id number]  Subjective:    Patient ID: Ronald Gardner, male    DOB: 1955/05/13, 56 y.o.   MRN: [id number]  HPI  56 year old patient seen today for a wellness exam. He enjoys excellent health and takes no chronic medications. He does have a history of colonic polyps and has had followup colonoscopy recently  Allergies (verified):  No Known Drug Allergies   Past History:  Past Medical History:  Reviewed history from 04/15/2008 and no changes required.  single admission for abdominal pain, and required upper endoscopy  Colonic polyps, hx of   Past Surgical History:  Reviewed history from 04/15/2008 and no changes required.  unremarkable  Colonoscopy 2007  2013  Family History:  Reviewed history from 04/15/2008 and no changes required.  father age 20 history of coronary artery disease, possibly valvular heart disease  mother, age 71. DJD, status post bilateral total knee replacement  one brother died of ALS, history of hypertension  two sisters are well except for some mild obesity   Social History:  Reviewed history from 04/15/2008 and no changes required.  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 Prostatitis     History   Social History  . Marital Status: Single    Spouse Name: N/A    Number of Children: N/A  . Years of Education: N/A   Occupational History  . Not on file.   Social History Main Topics  . Smoking status: Never Smoker   . Smokeless tobacco: Never Used  . Alcohol Use: 0.6 oz/week    1 Glasses of wine per week  . Drug Use: No  . Sexually Active: Not on file   Other Topics Concern  . Not on file   Social History Narrative  . No narrative on file    Past Surgical History  Procedure Date  . No prior surgeries     Family History  Problem Relation Age of Onset  . Heart disease Father   . Colon cancer Neg Hx     . Stomach cancer Neg Hx   . Rectal cancer Neg Hx   . Colon polyps Neg Hx     No Known Allergies  Current Outpatient Prescriptions on File Prior to Visit  Medication Sig Dispense Refill  . Saw Palmetto, Serenoa repens, (SAW PALMETTO PO) Take by mouth 2 (two) times daily.      . meloxicam (MOBIC) 15 MG tablet Take 15 mg by mouth daily.         BP 122/80  Pulse 78  Temp 98 F (36.7 C) (Oral)  Resp 16  Ht 5\' 10"  (1.778 m)  Wt 156 lb (70.761 kg)  BMI 22.38 kg/m2     Review of Systems  Constitutional: Negative for fever, chills, activity change, appetite change and fatigue.  HENT: Negative for hearing loss, ear pain, congestion, rhinorrhea, sneezing, mouth sores, trouble swallowing, neck pain, neck stiffness, dental problem, voice change, sinus pressure and tinnitus.   Eyes: Negative for photophobia, pain, redness and visual disturbance.  Respiratory: Negative for apnea, cough, choking, chest tightness, shortness of breath and wheezing.   Cardiovascular: Negative for chest pain, palpitations and leg swelling.  Gastrointestinal: Negative for nausea, vomiting, abdominal pain, diarrhea, constipation, blood in stool, abdominal distention, anal bleeding and rectal pain.  Genitourinary: Negative for dysuria, urgency, frequency, hematuria, flank pain,  decreased urine volume, discharge, penile swelling, scrotal swelling, difficulty urinating, genital sores and testicular pain.  Musculoskeletal: Negative for myalgias, back pain, joint swelling, arthralgias and gait problem.  Skin: Negative for color change, rash and wound.  Neurological: Negative for dizziness, tremors, seizures, syncope, facial asymmetry, speech difficulty, weakness, light-headedness, numbness and headaches.  Hematological: Negative for adenopathy. Does not bruise/bleed easily.  Psychiatric/Behavioral: Negative for suicidal ideas, hallucinations, behavioral problems, confusion, sleep disturbance, self-injury, dysphoric mood,  decreased concentration and agitation. The patient is not nervous/anxious.        Objective:   Physical Exam  Constitutional: He appears well-developed and well-nourished.  HENT:  Head: Normocephalic and atraumatic.  Right Ear: External ear normal.  Left Ear: External ear normal.  Nose: Nose normal.  Mouth/Throat: Oropharynx is clear and moist.  Eyes: Conjunctivae normal and EOM are normal. Pupils are equal, round, and reactive to light. No scleral icterus.  Neck: Normal range of motion. Neck supple. No JVD present. No thyromegaly present.  Cardiovascular: Regular rhythm, normal heart sounds and intact distal pulses.  Exam reveals no gallop and no friction rub.   No murmur heard. Pulmonary/Chest: Effort normal and breath sounds normal. He exhibits no tenderness.  Abdominal: Soft. Bowel sounds are normal. He exhibits no distension and no mass. There is no tenderness.  Genitourinary: Prostate normal and penis normal.  Musculoskeletal: Normal range of motion. He exhibits no edema and no tenderness.  Lymphadenopathy:    He has no cervical adenopathy.  Neurological: He is alert. He has normal reflexes. No cranial nerve deficit. Coordination normal.  Skin: Skin is warm and dry. No rash noted.  Psychiatric: He has a normal mood and affect. His behavior is normal.          Assessment & Plan:     Preventive Health Exam

## 2012-03-23 ENCOUNTER — Encounter: Payer: BC Managed Care – PPO | Admitting: Internal Medicine

## 2012-08-15 ENCOUNTER — Other Ambulatory Visit: Payer: Self-pay | Admitting: Internal Medicine

## 2012-08-15 MED ORDER — TAMSULOSIN HCL 0.4 MG PO CAPS: 0.4000 mg | ORAL_CAPSULE | Freq: Every day | ORAL | Status: DC

## 2013-02-28 ENCOUNTER — Telehealth: Payer: Self-pay | Admitting: Internal Medicine

## 2013-02-28 NOTE — Telephone Encounter (Signed)
Pt needs CPX can not order labs without physical. 

## 2013-02-28 NOTE — Telephone Encounter (Signed)
Pt would like cpx labs and no physical. Can I sch?

## 2013-02-28 NOTE — Telephone Encounter (Signed)
cpx has been sch

## 2013-03-13 ENCOUNTER — Other Ambulatory Visit (INDEPENDENT_AMBULATORY_CARE_PROVIDER_SITE_OTHER): Payer: BC Managed Care – PPO

## 2013-03-13 DIAGNOSIS — Z0000 Encounter for general adult medical examination without abnormal findings: Secondary | ICD-10-CM

## 2013-03-13 LAB — BASIC METABOLIC PANEL
CO2: 31 mEq/L (ref 19–32)
Chloride: 100 mEq/L (ref 96–112)
Creatinine, Ser: 0.9 mg/dL (ref 0.4–1.5)
Potassium: 4.2 mEq/L (ref 3.5–5.1)

## 2013-03-13 LAB — CBC WITH DIFFERENTIAL/PLATELET
Basophils Relative: 0.4 % (ref 0.0–3.0)
Eosinophils Absolute: 0 10*3/uL (ref 0.0–0.7)
Eosinophils Relative: 0.8 % (ref 0.0–5.0)
HCT: 45.8 % (ref 39.0–52.0)
Hemoglobin: 15.6 g/dL (ref 13.0–17.0)
Lymphs Abs: 1.5 10*3/uL (ref 0.7–4.0)
MCHC: 34.2 g/dL (ref 30.0–36.0)
MCV: 91.7 fl (ref 78.0–100.0)
Monocytes Absolute: 0.5 10*3/uL (ref 0.1–1.0)
Neutro Abs: 3.9 10*3/uL (ref 1.4–7.7)
Neutrophils Relative %: 65.4 % (ref 43.0–77.0)
RBC: 4.99 Mil/uL (ref 4.22–5.81)
WBC: 6 10*3/uL (ref 4.5–10.5)

## 2013-03-13 LAB — POCT URINALYSIS DIPSTICK
Glucose, UA: NEGATIVE
Ketones, UA: NEGATIVE
Leukocytes, UA: NEGATIVE
Nitrite, UA: NEGATIVE
Protein, UA: NEGATIVE
Urobilinogen, UA: 0.2
pH, UA: 7

## 2013-03-13 LAB — LIPID PANEL
LDL Cholesterol: 116 mg/dL — ABNORMAL HIGH (ref 0–99)
VLDL: 12.6 mg/dL (ref 0.0–40.0)

## 2013-03-13 LAB — PSA: PSA: 0.49 ng/mL (ref 0.10–4.00)

## 2013-03-13 LAB — HEPATIC FUNCTION PANEL
Albumin: 4 g/dL (ref 3.5–5.2)
Bilirubin, Direct: 0.1 mg/dL (ref 0.0–0.3)
Total Protein: 6.5 g/dL (ref 6.0–8.3)

## 2013-03-13 LAB — TSH: TSH: 1.17 u[IU]/mL (ref 0.35–5.50)

## 2013-03-15 ENCOUNTER — Other Ambulatory Visit: Payer: BC Managed Care – PPO

## 2013-04-09 ENCOUNTER — Encounter: Payer: Self-pay | Admitting: Internal Medicine

## 2013-04-09 ENCOUNTER — Ambulatory Visit (INDEPENDENT_AMBULATORY_CARE_PROVIDER_SITE_OTHER): Payer: BC Managed Care – PPO | Admitting: Internal Medicine

## 2013-04-09 VITALS — BP 110/74 | HR 72 | Temp 98.0°F | Resp 20 | Ht 70.0 in | Wt 163.0 lb

## 2013-04-09 DIAGNOSIS — Z0000 Encounter for general adult medical examination without abnormal findings: Secondary | ICD-10-CM

## 2013-04-09 DIAGNOSIS — Z23 Encounter for immunization: Secondary | ICD-10-CM

## 2013-04-09 DIAGNOSIS — Z86010 Personal history of colonic polyps: Secondary | ICD-10-CM

## 2013-04-09 MED ORDER — TAMSULOSIN HCL 0.4 MG PO CAPS: 0.4000 mg | ORAL_CAPSULE | Freq: Every day | ORAL | Status: DC

## 2013-04-09 NOTE — Patient Instructions (Signed)
Limit your sodium (Salt) intake    It is important that you exercise regularly, at least 20 minutes 3 to 4 times per week.  If you develop chest pain or shortness of breath seek  medical attention.  Return in one year for follow-up   

## 2013-04-09 NOTE — Progress Notes (Signed)
Subjective:    Patient ID: Ronald Gardner, male    DOB: 10/17/1955, 57 y.o.   MRN: [id number]  HPI Pre-visit discussion using our clinic review tool. No additional management support is needed unless otherwise documented below in the visit note.  Patient ID: Ronald Gardner, male   DOB: [birth year], 57 y.o.   MRN: [id number]  Subjective:    Patient ID: Ronald Gardner, male    DOB: Jul 20, 1955, 57 y.o.   MRN: [id number]  HPI  57  year old patient seen today for a wellness exam. He enjoys excellent health and takes no chronic medications. He does have a history of colonic polyps and has had followup colonoscopy in 2013. His only complaint is chronic perineal pain thought secondary to chronic prostatitis. He remains on Flomax and has been evaluated by urology  Allergies (verified):  No Known Drug Allergies   Past History:  Past Medical History:   single admission for abdominal pain, and required upper endoscopy  Colonic polyps, hx of  Chronic prostatitis  Past Surgical History:   unremarkable  Colonoscopy 2007  2013  Family History:   father age 70 history of coronary artery disease, possibly valvular heart disease  mother, age  52  DJD, status post bilateral total knee replacement  one brother died of ALS, history of hypertension  two sisters are well except for some mild obesity   Social History:    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 Prostatitis     History   Social History  . Marital Status: Single    Spouse Name: N/A    Number of Children: N/A  . Years of Education: N/A   Occupational History  . Not on file.   Social History Main Topics  . Smoking status: Never Smoker   . Smokeless tobacco: Never Used  . Alcohol Use: 0.6 oz/week    1 Glasses of wine per week  . Drug Use: No  . Sexual Activity: Not on file   Other Topics Concern  . Not on file   Social History Narrative  . No  narrative on file    Past Surgical History  Procedure Laterality Date  . No prior surgeries      Family History  Problem Relation Age of Onset  . Heart disease Father   . Colon cancer Neg Hx   . Stomach cancer Neg Hx   . Rectal cancer Neg Hx   . Colon polyps Neg Hx     No Known Allergies  Current Outpatient Prescriptions on File Prior to Visit  Medication Sig Dispense Refill  . tamsulosin (FLOMAX) 0.4 MG CAPS Take 1 capsule (0.4 mg total) by mouth daily.  90 capsule  6   No current facility-administered medications on file prior to visit.    BP 110/74  Pulse 72  Temp(Src) 98 F (36.7 C) (Oral)  Resp 20  Ht 5\' 10"  (1.778 m)  Wt 163 lb (73.936 kg)  BMI 23.39 kg/m2  SpO2 98%     Review of Systems  Constitutional: Negative for fever, chills, activity change, appetite change and fatigue.  HENT: Negative for hearing loss, ear pain, congestion, rhinorrhea, sneezing, mouth sores, trouble swallowing, neck pain, neck stiffness, dental problem, voice change, sinus pressure and tinnitus.   Eyes: Negative for photophobia, pain, redness and visual disturbance.  Respiratory: Negative for apnea, cough, choking, chest tightness, shortness of breath and wheezing.  Cardiovascular: Negative for chest pain, palpitations and leg swelling.  Gastrointestinal: Negative for nausea, vomiting, abdominal pain, diarrhea, constipation, blood in stool, abdominal distention, anal bleeding and rectal pain.  Genitourinary: Negative for dysuria, urgency, frequency, hematuria, flank pain, decreased urine volume, discharge, penile swelling, scrotal swelling, difficulty urinating, genital sores and testicular pain.  Musculoskeletal: Negative for myalgias, back pain, joint swelling, arthralgias and gait problem.  Skin: Negative for color change, rash and wound.  Neurological: Negative for dizziness, tremors, seizures, syncope, facial asymmetry, speech difficulty, weakness, light-headedness, numbness and  headaches.  Hematological: Negative for adenopathy. Does not bruise/bleed easily.  Psychiatric/Behavioral: Negative for suicidal ideas, hallucinations, behavioral problems, confusion, sleep disturbance, self-injury, dysphoric mood, decreased concentration and agitation. The patient is not nervous/anxious.        Objective:   Physical Exam  Constitutional: He appears well-developed and well-nourished.  HENT:  Head: Normocephalic and atraumatic.  Right Ear: External ear normal.  Left Ear: External ear normal.  Nose: Nose normal.  Mouth/Throat: Oropharynx is clear and moist.  Eyes: Conjunctivae normal and EOM are normal. Pupils are equal, round, and reactive to light. No scleral icterus.  Neck: Normal range of motion. Neck supple. No JVD present. No thyromegaly present.  Cardiovascular: Regular rhythm, normal heart sounds and intact distal pulses.  Exam reveals no gallop and no friction rub.   No murmur heard. Pulmonary/Chest: Effort normal and breath sounds normal. He exhibits no tenderness.  Abdominal: Soft. Bowel sounds are normal. He exhibits no distension and no mass. There is no tenderness.  Genitourinary: Prostate normal and penis normal.  Musculoskeletal: Normal range of motion. He exhibits no edema and no tenderness.  Lymphadenopathy:    He has no cervical adenopathy.  Neurological: He is alert. He has normal reflexes. No cranial nerve deficit. Coordination normal.  Skin: Skin is warm and dry. No rash noted.  Psychiatric: He has a normal mood and affect. His behavior is normal.          Assessment & Plan:     Preventive Health Exam     Review of Systems As above    Objective:   Physical Exam  As above      Assessment & Plan:  Preventive health examination We'll continue active exercise program and heart healthy diet. Recheck 1 year

## 2013-04-09 NOTE — Progress Notes (Signed)
Pre-visit discussion using our clinic review tool. No additional management support is needed unless otherwise documented below in the visit note.  

## 2013-11-26 ENCOUNTER — Ambulatory Visit (INDEPENDENT_AMBULATORY_CARE_PROVIDER_SITE_OTHER): Payer: BC Managed Care – PPO | Admitting: Internal Medicine

## 2013-11-26 ENCOUNTER — Encounter: Payer: Self-pay | Admitting: Internal Medicine

## 2013-11-26 VITALS — BP 110/72 | HR 56 | Temp 97.4°F | Resp 18 | Ht 70.0 in | Wt 163.0 lb

## 2013-11-26 DIAGNOSIS — B029 Zoster without complications: Secondary | ICD-10-CM

## 2013-11-26 MED ORDER — VALACYCLOVIR HCL 1 G PO TABS: 1000.0000 mg | ORAL_TABLET | Freq: Three times a day (TID) | ORAL | Status: DC

## 2013-11-26 MED ORDER — TRIAMCINOLONE ACETONIDE 0.1 % EX CREA: 1.0000 "application " | TOPICAL_CREAM | Freq: Two times a day (BID) | CUTANEOUS | Status: DC

## 2013-11-26 MED ORDER — VALACYCLOVIR HCL 1 G PO TABS: 1000.0000 mg | ORAL_TABLET | Freq: Two times a day (BID) | ORAL | Status: DC

## 2013-11-26 NOTE — Progress Notes (Signed)
Pre visit review using our clinic review tool, if applicable. No additional management support is needed unless otherwise documented below in the visit note. 

## 2013-11-26 NOTE — Progress Notes (Signed)
   Subjective:    Patient ID: Ronald Gardner, male    DOB: [birth year], 58 y.o.   MRN: [id number]  HPI 58 year old patient, who presents with a rash involving his left lateral chest wall area.  It extends from the mid back position, almost to the midline anteriorly.  Rash has been present for 3 days.  It is described as mildly uncomfortable  Past Medical History  Diagnosis Date  . Prostatitis     History   Social History  . Marital Status: Single    Spouse Name: N/A    Number of Children: N/A  . Years of Education: N/A   Occupational History  . Not on file.   Social History Main Topics  . Smoking status: Never Smoker   . Smokeless tobacco: Never Used  . Alcohol Use: 0.6 oz/week    1 Glasses of wine per week  . Drug Use: No  . Sexual Activity: Not on file   Other Topics Concern  . Not on file   Social History Narrative  . No narrative on file    Past Surgical History  Procedure Laterality Date  . No prior surgeries      Family History  Problem Relation Age of Onset  . Heart disease Father   . Colon cancer Neg Hx   . Stomach cancer Neg Hx   . Rectal cancer Neg Hx   . Colon polyps Neg Hx     No Known Allergies  Current Outpatient Prescriptions on File Prior to Visit  Medication Sig Dispense Refill  . tamsulosin (FLOMAX) 0.4 MG CAPS capsule Take 1 capsule (0.4 mg total) by mouth daily.  90 capsule  6   No current facility-administered medications on file prior to visit.    BP 110/72  Pulse 56  Temp(Src) 97.4 F (36.3 C) (Oral)  Resp 18  Ht 5\' 10"  (1.778 m)  Wt 163 lb (73.936 kg)  BMI 23.39 kg/m2  SpO2 98%       Review of Systems  Constitutional: Negative for fever, chills, appetite change and fatigue.  HENT: Negative for congestion, dental problem, ear pain, hearing loss, sore throat, tinnitus, trouble swallowing and voice change.   Eyes: Negative for pain, discharge and visual disturbance.  Respiratory: Negative for cough, chest tightness,  wheezing and stridor.   Cardiovascular: Negative for chest pain, palpitations and leg swelling.  Gastrointestinal: Negative for nausea, vomiting, abdominal pain, diarrhea, constipation, blood in stool and abdominal distention.  Genitourinary: Negative for urgency, hematuria, flank pain, discharge, difficulty urinating and genital sores.  Musculoskeletal: Negative for arthralgias, back pain, gait problem, joint swelling, myalgias and neck stiffness.  Skin: Positive for rash.  Neurological: Negative for dizziness, syncope, speech difficulty, weakness, numbness and headaches.  Hematological: Negative for adenopathy. Does not bruise/bleed easily.  Psychiatric/Behavioral: Negative for behavioral problems and dysphoric mood. The patient is not nervous/anxious.        Objective:   Physical Exam  Constitutional: He appears well-developed and well-nourished. No distress.  Skin:  Herpetic rash involving his left lateral chest wall. The most concentrated cluster of vesicles noted involving the left mid posterior back region          Assessment & Plan:   Shingles.  We'll treat with Valtrex and topical triamcinolone

## 2013-11-26 NOTE — Patient Instructions (Signed)
Shingles °Shingles (herpes zoster) is an infection that is caused by the same virus that causes chickenpox (varicella). The infection causes a painful skin rash and fluid-filled blisters, which eventually break open, crust over, and heal. It may occur in any area of the body, but it usually affects only one side of the body or face. The pain of shingles usually lasts about 1 month. However, some people with shingles may develop long-term (chronic) pain in the affected area of the body. °Shingles often occurs many years after the person had chickenpox. It is more common: °· In people older than 50 years. °· In people with weakened immune systems, such as those with HIV, AIDS, or cancer. °· In people taking medicines that weaken the immune system, such as transplant medicines. °· In people under great stress. °CAUSES  °Shingles is caused by the varicella zoster virus (VZV), which also causes chickenpox. After a person is infected with the virus, it can remain in the person's body for years in an inactive state (dormant). To cause shingles, the virus reactivates and breaks out as an infection in a nerve root. °The virus can be spread from person to person (contagious) through contact with open blisters of the shingles rash. It will only spread to people who have not had chickenpox. When these people are exposed to the virus, they may develop chickenpox. They will not develop shingles. Once the blisters scab over, the person is no longer contagious and cannot spread the virus to others. °SYMPTOMS  °Shingles shows up in stages. The initial symptoms may be pain, itching, and tingling in an area of the skin. This pain is usually described as burning, stabbing, or throbbing. In a few days or weeks, a painful red rash will appear in the area where the pain, itching, and tingling were felt. The rash is usually on one side of the body in a band or belt-like pattern. Then, the rash usually turns into fluid-filled blisters. They  will scab over and dry up in approximately 2-3 weeks. °Flu-like symptoms may also occur with the initial symptoms, the rash, or the blisters. These may include: °· Fever. °· Chills. °· Headache. °· Upset stomach. °DIAGNOSIS  °Your caregiver will perform a skin exam to diagnose shingles. Skin scrapings or fluid samples may also be taken from the blisters. This sample will be examined under a microscope or sent to a lab for further testing. °TREATMENT  °There is no specific cure for shingles. Your caregiver will likely prescribe medicines to help you manage the pain, recover faster, and avoid long-term problems. This may include antiviral drugs, anti-inflammatory drugs, and pain medicines. °HOME CARE INSTRUCTIONS  °· Take a cool bath or apply cool compresses to the area of the rash or blisters as directed. This may help with the pain and itching.   °· Only take over-the-counter or prescription medicines as directed by your caregiver.   °· Rest as directed by your caregiver. °· Keep your rash and blisters clean with mild soap and cool water or as directed by your caregiver.  °· Do not pick your blisters or scratch your rash. Apply an anti-itch cream or numbing creams to the affected area as directed by your caregiver. °· Keep your shingles rash covered with a loose bandage (dressing). °· Avoid skin contact with: °¨ Babies.   °¨ Pregnant women.   °¨ Children with eczema.   °¨ Elderly people with transplants.   °¨ People with chronic illnesses, such as leukemia or AIDS.   °· Wear loose-fitting clothing to help ease the   pain of material rubbing against the rash. °· Keep all follow-up appointments with your caregiver. If the area involved is on your face, you may receive a referral for follow-up to a specialist, such as an eye doctor (ophthalmologist) or an ear, nose, and throat (ENT) doctor. Keeping all follow-up appointments will help you avoid eye complications, chronic pain, or disability.   °SEEK IMMEDIATE MEDICAL  CARE IF:  °· You have facial pain, pain around the eye area, or loss of feeling on one side of your face. °· You have ear pain or ringing in your ear. °· You have loss of taste. °· Your pain is not relieved with prescribed medicines.   °· Your redness or swelling spreads.   °· You have more pain and swelling.  °· Your condition is worsening or has changed.   °· You have a fever or persistent symptoms for more than 2-3 days. °· You have a fever and your symptoms suddenly get worse. °MAKE SURE YOU: °· Understand these instructions. °· Will watch your condition. °· Will get help right away if you are not doing well or get worse. °Document Released: 04/26/2005 Document Revised: 01/19/2012 Document Reviewed: 12/09/2011 °[employer]® Patient Information ©2015 [employer]. This information is not intended to replace advice given to you by your health care provider. Make sure you discuss any questions you have with your health care provider. ° °

## 2014-03-14 ENCOUNTER — Other Ambulatory Visit: Payer: BC Managed Care – PPO

## 2014-03-18 ENCOUNTER — Other Ambulatory Visit (INDEPENDENT_AMBULATORY_CARE_PROVIDER_SITE_OTHER): Payer: BC Managed Care – PPO

## 2014-03-18 DIAGNOSIS — Z0000 Encounter for general adult medical examination without abnormal findings: Secondary | ICD-10-CM

## 2014-03-18 LAB — CBC WITH DIFFERENTIAL/PLATELET
BASOS ABS: 0 10*3/uL (ref 0.0–0.1)
Basophils Relative: 0.5 % (ref 0.0–3.0)
EOS ABS: 0 10*3/uL (ref 0.0–0.7)
EOS PCT: 0.8 % (ref 0.0–5.0)
HEMATOCRIT: 46.7 % (ref 39.0–52.0)
Hemoglobin: 15.3 g/dL (ref 13.0–17.0)
LYMPHS ABS: 1.4 10*3/uL (ref 0.7–4.0)
Lymphocytes Relative: 23.5 % (ref 12.0–46.0)
MCHC: 32.7 g/dL (ref 30.0–36.0)
MCV: 93.7 fl (ref 78.0–100.0)
Monocytes Absolute: 0.5 10*3/uL (ref 0.1–1.0)
Monocytes Relative: 8.2 % (ref 3.0–12.0)
NEUTROS PCT: 67 % (ref 43.0–77.0)
Neutro Abs: 3.9 10*3/uL (ref 1.4–7.7)
PLATELETS: 233 10*3/uL (ref 150.0–400.0)
RBC: 4.99 Mil/uL (ref 4.22–5.81)
RDW: 13.2 % (ref 11.5–15.5)
WBC: 5.8 10*3/uL (ref 4.0–10.5)

## 2014-03-18 LAB — BASIC METABOLIC PANEL
BUN: 12 mg/dL (ref 6–23)
CO2: 25 mEq/L (ref 19–32)
Calcium: 9.3 mg/dL (ref 8.4–10.5)
Chloride: 101 mEq/L (ref 96–112)
Creatinine, Ser: 1 mg/dL (ref 0.4–1.5)
GFR: 84.3 mL/min (ref >60.00)
Glucose, Bld: 87 mg/dL (ref 70–99)
POTASSIUM: 4.1 meq/L (ref 3.5–5.1)
Sodium: 138 mEq/L (ref 135–145)

## 2014-03-18 LAB — HEPATIC FUNCTION PANEL
ALBUMIN: 3.6 g/dL (ref 3.5–5.2)
ALK PHOS: 47 U/L (ref 39–117)
ALT: 16 U/L (ref 0–53)
AST: 19 U/L (ref 0–37)
BILIRUBIN TOTAL: 1.1 mg/dL (ref 0.2–1.2)
Bilirubin, Direct: 0.1 mg/dL (ref 0.0–0.3)
Total Protein: 6.8 g/dL (ref 6.0–8.3)

## 2014-03-18 LAB — POCT URINALYSIS DIPSTICK
Bilirubin, UA: NEGATIVE
GLUCOSE UA: NEGATIVE
Ketones, UA: NEGATIVE
Leukocytes, UA: NEGATIVE
Nitrite, UA: NEGATIVE
Protein, UA: NEGATIVE
RBC UA: NEGATIVE
SPEC GRAV UA: 1.015
UROBILINOGEN UA: 0.2
pH, UA: 8.5

## 2014-03-18 LAB — LIPID PANEL
CHOL/HDL RATIO: 3
Cholesterol: 198 mg/dL (ref 0–200)
HDL: 58.8 mg/dL (ref >39.00)
LDL Cholesterol: 127 mg/dL — ABNORMAL HIGH (ref 0–99)
NonHDL: 139.2
TRIGLYCERIDES: 59 mg/dL (ref 0.0–149.0)
VLDL: 11.8 mg/dL (ref 0.0–40.0)

## 2014-03-18 LAB — TSH: TSH: 1.21 u[IU]/mL (ref 0.35–4.50)

## 2014-03-18 LAB — PSA: PSA: 0.47 ng/mL (ref 0.10–4.00)

## 2014-04-08 ENCOUNTER — Other Ambulatory Visit: Payer: BC Managed Care – PPO

## 2014-04-09 ENCOUNTER — Ambulatory Visit (INDEPENDENT_AMBULATORY_CARE_PROVIDER_SITE_OTHER): Payer: BC Managed Care – PPO | Admitting: Internal Medicine

## 2014-04-09 ENCOUNTER — Encounter: Payer: Self-pay | Admitting: Internal Medicine

## 2014-04-09 VITALS — BP 110/70 | HR 76 | Temp 98.2°F | Resp 20 | Ht 70.0 in | Wt 164.0 lb

## 2014-04-09 DIAGNOSIS — Z23 Encounter for immunization: Secondary | ICD-10-CM

## 2014-04-09 DIAGNOSIS — Z0000 Encounter for general adult medical examination without abnormal findings: Secondary | ICD-10-CM

## 2014-04-09 NOTE — Progress Notes (Signed)
Subjective:    Patient ID: Ronald Gardner, male    DOB: 22-Feb-1956, 58 y.o.   MRN: [id number]  HPI  Pre-visit discussion using our clinic review tool. No additional management support is needed unless otherwise documented below in the visit note.  Patient ID: Ronald Gardner, male   DOB: 1956/02/02, 58 y.o.   MRN: [id number]  Subjective:    Patient ID: Ronald Gardner, male    DOB: 12-23-1955, 58 y.o.   MRN: [id number]  HPI  58  year old patient seen today for a wellness exam. He enjoys excellent health and takes no chronic medications. He does have a history of colonic polyps and has had followup colonoscopy in 2013. His only complaint is chronic perineal pain thought secondary to chronic prostatitis. He remains on Flomax and has been evaluated by urology  Allergies (verified):  No Known Drug Allergies   Past History:  Past Medical History:   single admission for abdominal pain, and required upper endoscopy  Colonic polyps, hx of  Chronic prostatitis  Past Surgical History:   unremarkable  Colonoscopy 2007  2013  Family History:   father age 76 history of coronary artery disease, possibly valvular heart disease  mother, age  86  DJD, status post bilateral total knee replacement  one brother died of ALS, history of hypertension  two sisters are well except for some mild obesity   Social History:    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 Prostatitis     History   Social History  . Marital Status: Single    Spouse Name: N/A    Number of Children: N/A  . Years of Education: N/A   Occupational History  . Not on file.   Social History Main Topics  . Smoking status: Never Smoker   . Smokeless tobacco: Never Used  . Alcohol Use: 0.6 oz/week    1 Glasses of wine per week  . Drug Use: No  . Sexual Activity: Not on file   Other Topics Concern  . Not on file   Social History Narrative     Past Surgical History  Procedure Laterality Date  . No prior surgeries      Family History  Problem Relation Age of Onset  . Heart disease Father   . Colon cancer Neg Hx   . Stomach cancer Neg Hx   . Rectal cancer Neg Hx   . Colon polyps Neg Hx     No Known Allergies  Current Outpatient Prescriptions on File Prior to Visit  Medication Sig Dispense Refill  . tamsulosin (FLOMAX) 0.4 MG CAPS capsule Take 1 capsule (0.4 mg total) by mouth daily. 90 capsule 6  . triamcinolone cream (KENALOG) 0.1 % Apply 1 application topically 2 (two) times daily. 30 g 0   No current facility-administered medications on file prior to visit.    BP 110/70 mmHg  Pulse 76  Temp(Src) 98.2 F (36.8 C) (Oral)  Resp 20  Ht 5\' 10"  (1.778 m)  Wt 164 lb (74.39 kg)  BMI 23.53 kg/m2  SpO2 98%     Review of Systems  Constitutional: Negative for fever, chills, activity change, appetite change and fatigue.  HENT: Negative for hearing loss, ear pain, congestion, rhinorrhea, sneezing, mouth sores, trouble swallowing, neck pain, neck stiffness, dental problem, voice change, sinus pressure and tinnitus.   Eyes: Negative for photophobia, pain, redness and visual disturbance.  Respiratory: Negative for apnea, cough, choking, chest tightness, shortness of breath and wheezing.   Cardiovascular: Negative for chest pain, palpitations and leg swelling.  Gastrointestinal: Negative for nausea, vomiting, abdominal pain, diarrhea, constipation, blood in stool, abdominal distention, anal bleeding and rectal pain.  Genitourinary: Negative for dysuria, urgency, frequency, hematuria, flank pain, decreased urine volume, discharge, penile swelling, scrotal swelling, difficulty urinating, genital sores and testicular pain.  Musculoskeletal: Negative for myalgias, back pain, joint swelling, arthralgias and gait problem.  Skin: Negative for color change, rash and wound.  Neurological: Negative for dizziness, tremors,  seizures, syncope, facial asymmetry, speech difficulty, weakness, light-headedness, numbness and headaches.  Hematological: Negative for adenopathy. Does not bruise/bleed easily.  Psychiatric/Behavioral: Negative for suicidal ideas, hallucinations, behavioral problems, confusion, sleep disturbance, self-injury, dysphoric mood, decreased concentration and agitation. The patient is not nervous/anxious.        Objective:   Physical Exam  Constitutional: He appears well-developed and well-nourished.  HENT:  Head: Normocephalic and atraumatic.  Right Ear: External ear normal.  Left Ear: External ear normal.  Nose: Nose normal.  Mouth/Throat: Oropharynx is clear and moist.  Eyes: Conjunctivae normal and EOM are normal. Pupils are equal, round, and reactive to light. No scleral icterus.  Neck: Normal range of motion. Neck supple. No JVD present. No thyromegaly present.  Cardiovascular: Regular rhythm, normal heart sounds and intact distal pulses.  Exam reveals no gallop and no friction rub.   No murmur heard. Pulmonary/Chest: Effort normal and breath sounds normal. He exhibits no tenderness.  Abdominal: Soft. Bowel sounds are normal. He exhibits no distension and no mass. There is no tenderness.  Genitourinary: Prostate normal and penis normal.  Musculoskeletal: Normal range of motion. He exhibits no edema and no tenderness.  Lymphadenopathy:    He has no cervical adenopathy.  Neurological: He is alert. He has normal reflexes. No cranial nerve deficit. Coordination normal.  Skin: Skin is warm and dry. No rash noted.  Psychiatric: He has a normal mood and affect. His behavior is normal.          Assessment & Plan:     Preventive Health Exam     Review of Systems  As above    Objective:   Physical Exam   As above      Assessment & Plan:  Preventive health examination We'll continue active exercise program and heart healthy diet. Recheck 1 year

## 2014-04-09 NOTE — Patient Instructions (Signed)

## 2014-04-09 NOTE — Progress Notes (Signed)
Pre visit review using our clinic review tool, if applicable. No additional management support is needed unless otherwise documented below in the visit note. 

## 2014-04-11 ENCOUNTER — Encounter: Payer: BC Managed Care – PPO | Admitting: Internal Medicine

## 2014-05-02 ENCOUNTER — Other Ambulatory Visit: Payer: Self-pay | Admitting: Internal Medicine

## 2014-05-29 ENCOUNTER — Encounter: Payer: Self-pay | Admitting: Internal Medicine

## 2014-07-04 ENCOUNTER — Ambulatory Visit (INDEPENDENT_AMBULATORY_CARE_PROVIDER_SITE_OTHER): Payer: BLUE CROSS/BLUE SHIELD | Admitting: Family Medicine

## 2014-07-04 ENCOUNTER — Encounter: Payer: Self-pay | Admitting: Family Medicine

## 2014-07-04 VITALS — BP 120/70 | HR 84 | Temp 98.2°F | Wt 165.0 lb

## 2014-07-04 DIAGNOSIS — L03012 Cellulitis of left finger: Secondary | ICD-10-CM

## 2014-07-04 MED ORDER — DOXYCYCLINE HYCLATE 100 MG PO CAPS: 100.0000 mg | ORAL_CAPSULE | Freq: Two times a day (BID) | ORAL | Status: DC

## 2014-07-04 NOTE — Progress Notes (Signed)
   Subjective:    Patient ID: Ronald Gardner, male    DOB: [birth year], 59 y.o.   MRN: [id number]  HPI Patient seen with swelling and inflammation left thumb over the past month or so. Denies any injury. No fevers or chills. He denies any drainage. He's not been soaking this or treating this with anything topically. He has no history of diabetes or any other significant chronic medical problems.  Past Medical History  Diagnosis Date  . Prostatitis    Past Surgical History  Procedure Laterality Date  . No prior surgeries      reports that he has never smoked. He has never used smokeless tobacco. He reports that he drinks about 0.6 oz of alcohol per week. He reports that he does not use illicit drugs. family history includes Heart disease in his father. There is no history of Colon cancer, Stomach cancer, Rectal cancer, or Colon polyps. No Known Allergies    Review of Systems  Constitutional: Negative for fever and chills.       Objective:   Physical Exam  Constitutional: He appears well-developed and well-nourished.  Cardiovascular: Normal rate and regular rhythm.   Skin:  Left thumb reveals some mild erythema and mild swelling around the cuticle. No visible paronychia. No significant fluctuance. Minimally tender.          Assessment & Plan:  Question chronic paronychia left thumb. He does not have any obvious purulent pockets to drain. He does have some possible cellulitis changes. Doxycycline 100 mg twice a day for 14 days. Consider topical corticosteroid cream to reduce inflammation. Follow-up with primary in 2 weeks if not improving

## 2014-07-04 NOTE — Progress Notes (Signed)
Pre visit review using our clinic review tool, if applicable. No additional management support is needed unless otherwise documented below in the visit note. 

## 2014-07-04 NOTE — Patient Instructions (Signed)
Consider over the counter anti-fungal cream such as Lamisil or Lotrimin

## 2014-07-05 ENCOUNTER — Ambulatory Visit: Payer: Self-pay | Admitting: Internal Medicine

## 2014-07-11 ENCOUNTER — Telehealth: Payer: Self-pay | Admitting: Internal Medicine

## 2014-07-11 NOTE — Telephone Encounter (Signed)
error 

## 2014-07-12 ENCOUNTER — Ambulatory Visit (INDEPENDENT_AMBULATORY_CARE_PROVIDER_SITE_OTHER): Payer: BLUE CROSS/BLUE SHIELD | Admitting: Internal Medicine

## 2014-07-12 VITALS — BP 110/68 | Temp 98.1°F | Wt 162.0 lb

## 2014-07-12 DIAGNOSIS — L03012 Cellulitis of left finger: Secondary | ICD-10-CM

## 2014-07-12 MED ORDER — AMOXICILLIN-POT CLAVULANATE 875-125 MG PO TABS: 1.0000 | ORAL_TABLET | Freq: Two times a day (BID) | ORAL | Status: DC

## 2014-07-12 NOTE — Patient Instructions (Signed)

## 2014-07-12 NOTE — Progress Notes (Signed)
   Subjective:    Patient ID: Ronald Gardner, male    DOB: [birth year], 59 y.o.   MRN: [id number]  HPI  59 year old patient who is seen today complaining of persistent and worsening pain and redness involving his left great thumb.  He was seen earlier with a possible early paronychia and doxycycline was prescribed.  Soft tissue swelling, pain and erythema has worsened.  No fever or constitutional complaints  Past Medical History  Diagnosis Date  . Prostatitis     History   Social History  . Marital Status: Single    Spouse Name: N/A  . Number of Children: N/A  . Years of Education: N/A   Occupational History  . Not on file.   Social History Main Topics  . Smoking status: Never Smoker   . Smokeless tobacco: Never Used  . Alcohol Use: 0.6 oz/week    1 Glasses of wine per week  . Drug Use: No  . Sexual Activity: Not on file   Other Topics Concern  . Not on file   Social History Narrative    Past Surgical History  Procedure Laterality Date  . No prior surgeries      Family History  Problem Relation Age of Onset  . Heart disease Father   . Colon cancer Neg Hx   . Stomach cancer Neg Hx   . Rectal cancer Neg Hx   . Colon polyps Neg Hx     No Known Allergies  Current Outpatient Prescriptions on File Prior to Visit  Medication Sig Dispense Refill  . doxycycline (VIBRAMYCIN) 100 MG capsule Take 1 capsule (100 mg total) by mouth 2 (two) times daily. 28 capsule 0  . tamsulosin (FLOMAX) 0.4 MG CAPS capsule TAKE 1 CAPSULE (0.4 MG TOTAL) BY MOUTH DAILY. 90 capsule 3   No current facility-administered medications on file prior to visit.    BP 110/68 mmHg  Temp(Src) 98.1 F (36.7 C) (Oral)  Wt 162 lb (73.483 kg)     Review of Systems  Skin: Positive for wound.       Objective:   Physical Exam  Constitutional: He appears well-developed and well-nourished. No distress.  Skin:  Erythema, tenderness and some soft tissue swelling about the medial aspect of the  left distal thumb          Assessment & Plan:   Chronic paronychia left thumb. We'll switch to Augmentin and doxycycline will be discontinued Patient instructions discussed and dispensed We'll gently massage the area of inflammation proximal to distal.  Following each soaking

## 2014-07-12 NOTE — Progress Notes (Signed)
Pre visit review using our clinic review tool, if applicable. No additional management support is needed unless otherwise documented below in the visit note. 

## 2014-07-25 ENCOUNTER — Telehealth: Payer: Self-pay | Admitting: Internal Medicine

## 2014-07-25 NOTE — Telephone Encounter (Signed)
Pt was seen on 3-4 and thumb is still having trouble . Pt [facility]x. [facility]. Please advise

## 2014-07-25 NOTE — Telephone Encounter (Signed)
Please see message and advise 

## 2014-07-26 NOTE — Telephone Encounter (Signed)
Please call and check on his status

## 2014-07-29 ENCOUNTER — Ambulatory Visit (INDEPENDENT_AMBULATORY_CARE_PROVIDER_SITE_OTHER): Payer: BLUE CROSS/BLUE SHIELD | Admitting: Internal Medicine

## 2014-07-29 ENCOUNTER — Telehealth: Payer: Self-pay | Admitting: Internal Medicine

## 2014-07-29 ENCOUNTER — Encounter: Payer: Self-pay | Admitting: Internal Medicine

## 2014-07-29 VITALS — BP 120/80 | HR 75 | Temp 98.0°F | Resp 20 | Ht 70.0 in | Wt 157.0 lb

## 2014-07-29 DIAGNOSIS — T887XXA Unspecified adverse effect of drug or medicament, initial encounter: Secondary | ICD-10-CM

## 2014-07-29 DIAGNOSIS — T50905A Adverse effect of unspecified drugs, medicaments and biological substances, initial encounter: Secondary | ICD-10-CM

## 2014-07-29 MED ORDER — ZOLPIDEM TARTRATE 10 MG PO TABS: 10.0000 mg | ORAL_TABLET | Freq: Every evening | ORAL | Status: DC | PRN

## 2014-07-29 NOTE — Telephone Encounter (Signed)
Please schedule office appointment today

## 2014-07-29 NOTE — Progress Notes (Signed)
Pre visit review using our clinic review tool, if applicable. No additional management support is needed unless otherwise documented below in the visit note. 

## 2014-07-29 NOTE — Telephone Encounter (Signed)
Dr. Raliegh Ip, left message for pt to come to the office today.

## 2014-07-29 NOTE — Telephone Encounter (Signed)
Pt called back, stated left thumb is still swollen and has developed 5 red dots on tips of fingers and there is one on left thumb. Pt said areas are tender to touch. Pt said reminds him of shingles. Told pt will send message to Dr.K and get back to him. Pt verbalized understanding.

## 2014-07-29 NOTE — Patient Instructions (Signed)
Call or return to clinic prn if these symptoms worsen or fail to improve as anticipated.

## 2014-07-29 NOTE — Telephone Encounter (Signed)
Pt is returning donna call °

## 2014-07-29 NOTE — Progress Notes (Signed)
   Subjective:    Patient ID: Ronald Gardner, male    DOB: 10/16/1955, 59 y.o.   MRN: [id number]  HPI  Patient seen today in follow-up.  He was seen initially for paronychia involving his left thumb.  He was initially treated with doxycycline and after he failed to improve, was placed on Augmentin.  His left thumb has improved but over the past several days he has noted some erythema and pain involving several fingertips of both hands. He has recently completed Augmentin therapy No fever or constitutional complaints  Past Medical History  Diagnosis Date  . Prostatitis     History   Social History  . Marital Status: Single    Spouse Name: N/A  . Number of Children: N/A  . Years of Education: N/A   Occupational History  . Not on file.   Social History Main Topics  . Smoking status: Never Smoker   . Smokeless tobacco: Never Used  . Alcohol Use: 0.6 oz/week    1 Glasses of wine per week  . Drug Use: No  . Sexual Activity: Not on file   Other Topics Concern  . Not on file   Social History Narrative    Past Surgical History  Procedure Laterality Date  . No prior surgeries      Family History  Problem Relation Age of Onset  . Heart disease Father   . Colon cancer Neg Hx   . Stomach cancer Neg Hx   . Rectal cancer Neg Hx   . Colon polyps Neg Hx     No Known Allergies  Current Outpatient Prescriptions on File Prior to Visit  Medication Sig Dispense Refill  . tamsulosin (FLOMAX) 0.4 MG CAPS capsule TAKE 1 CAPSULE (0.4 MG TOTAL) BY MOUTH DAILY. 90 capsule 3   No current facility-administered medications on file prior to visit.    BP 120/80 mmHg  Pulse 75  Temp(Src) 98 F (36.7 C) (Oral)  Resp 20  Ht 5\' 10"  (1.778 m)  Wt 157 lb (71.215 kg)  BMI 22.53 kg/m2  SpO2 98%     Review of Systems  Skin: Positive for rash.       Objective:   Physical Exam  Constitutional: He appears well-developed and well-nourished. No distress.  Skin:  The paronychia  involving his left great thumb largely resolved with some postinflammatory scaling only, no erythema or tenderness  The left second and fifth fingertips revealed single tiny areas of erythema 2-3 mm in diameter that was slightly tender to palpation          Assessment & Plan:   Paronychia left thumb, resolved Possible adverse drug reaction to Augmentin.  Will observe at this time.  He has completed therapy will report any clinical deterioration

## 2014-07-29 NOTE — Telephone Encounter (Signed)
Pt has to go out, so if you will cb on his cell please

## 2014-08-29 ENCOUNTER — Telehealth: Payer: Self-pay | Admitting: Internal Medicine

## 2014-08-29 MED ORDER — ZOLPIDEM TARTRATE 10 MG PO TABS: 10.0000 mg | ORAL_TABLET | Freq: Every evening | ORAL | Status: DC | PRN

## 2014-08-29 NOTE — Telephone Encounter (Signed)
Pt notified Rx called into the pharmacy. Pt verbalized understanding.

## 2014-08-29 NOTE — Telephone Encounter (Signed)
Pt is asking if this can be refilled.     zolpidem (AMBIEN) 10 MG tablet(Expired)            He know that that Dr Raliegh Ip is out of the office and is willing to wait    [facility] [place]

## 2014-08-30 NOTE — Telephone Encounter (Signed)
error 

## 2015-03-19 ENCOUNTER — Other Ambulatory Visit (INDEPENDENT_AMBULATORY_CARE_PROVIDER_SITE_OTHER): Payer: BLUE CROSS/BLUE SHIELD

## 2015-03-19 DIAGNOSIS — Z0000 Encounter for general adult medical examination without abnormal findings: Secondary | ICD-10-CM

## 2015-03-19 LAB — POCT URINALYSIS DIPSTICK
Bilirubin, UA: NEGATIVE
Blood, UA: NEGATIVE
Glucose, UA: NEGATIVE
Ketones, UA: NEGATIVE
Leukocytes, UA: NEGATIVE
NITRITE UA: NEGATIVE
Protein, UA: NEGATIVE
Spec Grav, UA: 1.015
UROBILINOGEN UA: 0.2
pH, UA: 7.5

## 2015-03-19 LAB — BASIC METABOLIC PANEL
BUN: 12 mg/dL (ref 6–23)
CALCIUM: 9.5 mg/dL (ref 8.4–10.5)
CO2: 31 mEq/L (ref 19–32)
Chloride: 101 mEq/L (ref 96–112)
Creatinine, Ser: 0.88 mg/dL (ref 0.40–1.50)
GFR: 94.01 mL/min (ref >60.00)
GLUCOSE: 92 mg/dL (ref 70–99)
Potassium: 4 mEq/L (ref 3.5–5.1)
Sodium: 139 mEq/L (ref 135–145)

## 2015-03-19 LAB — LIPID PANEL
CHOL/HDL RATIO: 3
CHOLESTEROL: 195 mg/dL (ref 0–200)
HDL: 59.2 mg/dL (ref >39.00)
LDL Cholesterol: 117 mg/dL — ABNORMAL HIGH (ref 0–99)
NonHDL: 135.71
TRIGLYCERIDES: 92 mg/dL (ref 0.0–149.0)
VLDL: 18.4 mg/dL (ref 0.0–40.0)

## 2015-03-19 LAB — CBC WITH DIFFERENTIAL/PLATELET
BASOS ABS: 0 10*3/uL (ref 0.0–0.1)
Basophils Relative: 0.4 % (ref 0.0–3.0)
Eosinophils Absolute: 0 10*3/uL (ref 0.0–0.7)
Eosinophils Relative: 0.5 % (ref 0.0–5.0)
HCT: 46.7 % (ref 39.0–52.0)
HEMOGLOBIN: 15.4 g/dL (ref 13.0–17.0)
LYMPHS ABS: 1.5 10*3/uL (ref 0.7–4.0)
Lymphocytes Relative: 22.1 % (ref 12.0–46.0)
MCHC: 33 g/dL (ref 30.0–36.0)
MCV: 93.8 fl (ref 78.0–100.0)
MONO ABS: 0.6 10*3/uL (ref 0.1–1.0)
MONOS PCT: 9.4 % (ref 3.0–12.0)
Neutro Abs: 4.6 10*3/uL (ref 1.4–7.7)
Neutrophils Relative %: 67.6 % (ref 43.0–77.0)
Platelets: 267 10*3/uL (ref 150.0–400.0)
RBC: 4.98 Mil/uL (ref 4.22–5.81)
RDW: 13.3 % (ref 11.5–15.5)
WBC: 6.8 10*3/uL (ref 4.0–10.5)

## 2015-03-19 LAB — HEPATIC FUNCTION PANEL
ALK PHOS: 53 U/L (ref 39–117)
ALT: 11 U/L (ref 0–53)
AST: 13 U/L (ref 0–37)
Albumin: 4.3 g/dL (ref 3.5–5.2)
BILIRUBIN TOTAL: 0.9 mg/dL (ref 0.2–1.2)
Bilirubin, Direct: 0.2 mg/dL (ref 0.0–0.3)
Total Protein: 6.6 g/dL (ref 6.0–8.3)

## 2015-03-19 LAB — TSH: TSH: 1.24 u[IU]/mL (ref 0.35–4.50)

## 2015-03-19 LAB — PSA: PSA: 0.55 ng/mL (ref 0.10–4.00)

## 2015-03-20 ENCOUNTER — Other Ambulatory Visit: Payer: BLUE CROSS/BLUE SHIELD

## 2015-04-07 ENCOUNTER — Ambulatory Visit (INDEPENDENT_AMBULATORY_CARE_PROVIDER_SITE_OTHER): Payer: BLUE CROSS/BLUE SHIELD | Admitting: Internal Medicine

## 2015-04-07 ENCOUNTER — Encounter: Payer: Self-pay | Admitting: Internal Medicine

## 2015-04-07 VITALS — BP 124/76 | HR 73 | Temp 98.1°F | Resp 20 | Ht 69.5 in | Wt 166.0 lb

## 2015-04-07 DIAGNOSIS — Z23 Encounter for immunization: Secondary | ICD-10-CM | POA: Diagnosis not present

## 2015-04-07 DIAGNOSIS — Z86010 Personal history of colonic polyps: Secondary | ICD-10-CM

## 2015-04-07 DIAGNOSIS — Z0000 Encounter for general adult medical examination without abnormal findings: Secondary | ICD-10-CM | POA: Diagnosis not present

## 2015-04-07 MED ORDER — ZOLPIDEM TARTRATE 10 MG PO TABS: 10.0000 mg | ORAL_TABLET | Freq: Every evening | ORAL | Status: DC | PRN

## 2015-04-07 NOTE — Progress Notes (Signed)
Subjective:    Patient ID: Ronald Gardner, male    DOB: 05-18-1955, 59 y.o.   MRN: [id number]  HPI  Pre-visit discussion using our clinic review tool. No additional management support is needed unless otherwise documented below in the visit note.  Patient ID: Ronald Gardner, male   DOB: 10-11-1955, 59 y.o.   MRN: [id number]  Subjective:    Patient ID: Ronald Gardner, male    DOB: [birth year], 59 y.o.   MRN: [id number]  HPI  59 year old patient seen today for a wellness exam.  He enjoys excellent health and takes no chronic medications. He does have a history of colonic polyps and has had followup colonoscopy in 2013.   Doing quite well.  About 1 week ago he experienced a puncture injury to the flexor surface of his left third finger.  Over the past week he has been unable to fully flex the distal finger  Allergies (verified):  No Known Drug Allergies   Past History:  Past Medical History:   single admission for abdominal pain, and required upper endoscopy  Colonic polyps, hx of  Chronic prostatitis  Past Surgical History:   unremarkable  Colonoscopy 2007  2013  Family History:   father age 79 history of coronary artery disease, possibly valvular heart disease  mother, age  11  DJD, status post bilateral total knee replacement  one brother died of ALS, history of hypertension  two sisters are well except for some mild obesity   Social History:    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 Prostatitis     Social History   Social History  . Marital Status: Single    Spouse Name: N/A  . Number of Children: N/A  . Years of Education: N/A   Occupational History  . Not on file.   Social History Main Topics  . Smoking status: Never Smoker   . Smokeless tobacco: Never Used  . Alcohol Use: 0.6 oz/week    1 Glasses of wine per week  . Drug Use: No  . Sexual Activity: Not on file   Other Topics  Concern  . Not on file   Social History Narrative    Past Surgical History  Procedure Laterality Date  . No prior surgeries      Family History  Problem Relation Age of Onset  . Heart disease Father   . Colon cancer Neg Hx   . Stomach cancer Neg Hx   . Rectal cancer Neg Hx   . Colon polyps Neg Hx     No Known Allergies  Current Outpatient Prescriptions on File Prior to Visit  Medication Sig Dispense Refill  . zolpidem (AMBIEN) 10 MG tablet Take 1 tablet (10 mg total) by mouth at bedtime as needed for sleep. 15 tablet 1   No current facility-administered medications on file prior to visit.    There were no vitals taken for this visit.     Review of Systems  Constitutional: Negative for fever, chills, activity change, appetite change and fatigue.  HENT: Negative for hearing loss, ear pain, congestion, rhinorrhea, sneezing, mouth sores, trouble swallowing, neck pain, neck stiffness, dental problem, voice change, sinus pressure and tinnitus.   Eyes: Negative for photophobia, pain, redness and visual disturbance.  Respiratory: Negative for apnea, cough, choking, chest tightness, shortness of breath and wheezing.   Cardiovascular: Negative for chest pain, palpitations and leg swelling.  Gastrointestinal: Negative for nausea, vomiting, abdominal pain, diarrhea, constipation, blood in stool, abdominal distention, anal bleeding and rectal pain.  Genitourinary: Negative for dysuria, urgency, frequency, hematuria, flank pain, decreased urine volume, discharge, penile swelling, scrotal swelling, difficulty urinating, genital sores and testicular pain.  Musculoskeletal: Negative for myalgias, back pain, joint swelling, arthralgias and gait problem.  Skin: Negative for color change, rash and wound.  Neurological: Negative for dizziness, tremors, seizures, syncope, facial asymmetry, speech difficulty, weakness, light-headedness, numbness and headaches.  Hematological: Negative for  adenopathy. Does not bruise/bleed easily.  Psychiatric/Behavioral: Negative for suicidal ideas, hallucinations, behavioral problems, confusion, sleep disturbance, self-injury, dysphoric mood, decreased concentration and agitation. The patient is not nervous/anxious.        Objective:   Physical Exam  Constitutional: He appears well-developed and well-nourished.  HENT:  Head: Normocephalic and atraumatic.  Right Ear: External ear normal.  Left Ear: External ear normal.  Nose: Nose normal.  Mouth/Throat: Oropharynx is clear and moist.  Eyes: Conjunctivae normal and EOM are normal. Pupils are equal, round, and reactive to light. No scleral icterus.  Neck: Normal range of motion. Neck supple. No JVD present. No thyromegaly present.  Cardiovascular: Regular rhythm, normal heart sounds and intact distal pulses.  Exam reveals no gallop and no friction rub.   No murmur heard. Pulmonary/Chest: Effort normal and breath sounds normal. He exhibits no tenderness.  Abdominal: Soft. Bowel sounds are normal. He exhibits no distension and no mass. There is no tenderness.  Genitourinary: Prostate normal and penis normal.  Musculoskeletal: Normal range of motion. He exhibits no edema and no tenderness.  Lymphadenopathy:    He has no cervical adenopathy.  Neurological: He is alert. He has normal reflexes. No cranial nerve deficit. Coordination normal.  Skin: Skin is warm and dry. No rash noted.  Psychiatric: He has a normal mood and affect. His behavior is normal.          Assessment & Plan:     Preventive Health Exam     Review of Systems  As above    Objective:   Physical Exam  Musculoskeletal:  Unable to flex the left third DIP joint    As above      Assessment & Plan:  Preventive health examination We'll continue active exercise program and heart healthy diet. Recheck 1 year  Referral for hand surgical consult.  Discussed and encouraged.  He will consider.  He is aware  that he probably has a partially severed flexor tendon of the third finger left hand

## 2015-04-07 NOTE — Patient Instructions (Addendum)

## 2015-04-07 NOTE — Progress Notes (Signed)
Pre visit review using our clinic review tool, if applicable. No additional management support is needed unless otherwise documented below in the visit note. 

## 2015-04-08 ENCOUNTER — Other Ambulatory Visit: Payer: BLUE CROSS/BLUE SHIELD

## 2015-04-10 ENCOUNTER — Telehealth: Payer: Self-pay | Admitting: Internal Medicine

## 2015-04-10 DIAGNOSIS — S61432A Puncture wound without foreign body of left hand, initial encounter: Secondary | ICD-10-CM

## 2015-04-10 NOTE — Telephone Encounter (Signed)
Patient came in stating that DR. K was going to refer him to hand surgeon.

## 2015-04-10 NOTE — Telephone Encounter (Signed)
Please advise what surgeon you want pt to see?

## 2015-04-11 NOTE — Telephone Encounter (Signed)
Any  hand surgeon covered by his plan will be acceptable This needs to be scheduled as soon as possible

## 2015-04-11 NOTE — Telephone Encounter (Signed)
Urgent referral order to Ortho for hand surgeon done and Deborah notified.

## 2015-04-14 ENCOUNTER — Other Ambulatory Visit: Payer: Self-pay | Admitting: Orthopedic Surgery

## 2015-04-15 ENCOUNTER — Encounter (HOSPITAL_BASED_OUTPATIENT_CLINIC_OR_DEPARTMENT_OTHER): Payer: Self-pay | Admitting: *Deleted

## 2015-04-17 ENCOUNTER — Encounter (HOSPITAL_BASED_OUTPATIENT_CLINIC_OR_DEPARTMENT_OTHER)
Admission: RE | Disposition: A | Discharge status: Home / Self Care | Payer: Self-pay | Source: Ambulatory Visit | Attending: Orthopedic Surgery

## 2015-04-17 ENCOUNTER — Ambulatory Visit (HOSPITAL_BASED_OUTPATIENT_CLINIC_OR_DEPARTMENT_OTHER): Payer: BLUE CROSS/BLUE SHIELD | Admitting: Certified Registered"

## 2015-04-17 ENCOUNTER — Ambulatory Visit (HOSPITAL_BASED_OUTPATIENT_CLINIC_OR_DEPARTMENT_OTHER)
Admission: RE | Admit: 2015-04-17 | Discharge: 2015-04-17 | Disposition: A | Discharge status: Home / Self Care | Payer: BLUE CROSS/BLUE SHIELD | Source: Ambulatory Visit | Attending: Orthopedic Surgery | Admitting: Orthopedic Surgery

## 2015-04-17 ENCOUNTER — Encounter (HOSPITAL_BASED_OUTPATIENT_CLINIC_OR_DEPARTMENT_OTHER): Payer: Self-pay | Admitting: Certified Registered"

## 2015-04-17 ENCOUNTER — Encounter: Payer: BLUE CROSS/BLUE SHIELD | Admitting: Internal Medicine

## 2015-04-17 DIAGNOSIS — N419 Inflammatory disease of prostate, unspecified: Secondary | ICD-10-CM | POA: Diagnosis not present

## 2015-04-17 DIAGNOSIS — Y93G3 Activity, cooking and baking: Secondary | ICD-10-CM | POA: Insufficient documentation

## 2015-04-17 DIAGNOSIS — Y92000 Kitchen of unspecified non-institutional (private) residence as  the place of occurrence of the external cause: Secondary | ICD-10-CM | POA: Diagnosis not present

## 2015-04-17 DIAGNOSIS — S66123A Laceration of flexor muscle, fascia and tendon of left middle finger at wrist and hand level, initial encounter: Secondary | ICD-10-CM | POA: Insufficient documentation

## 2015-04-17 DIAGNOSIS — W260XXA Contact with knife, initial encounter: Secondary | ICD-10-CM | POA: Diagnosis not present

## 2015-04-17 DIAGNOSIS — Y998 Other external cause status: Secondary | ICD-10-CM | POA: Diagnosis not present

## 2015-04-17 HISTORY — PX: FLEXOR TENDON REPAIR: SHX6501

## 2015-04-17 HISTORY — DX: Unspecified injury of other specified muscles, fascia and tendons at wrist and hand level, left hand, initial encounter: S66.802A

## 2015-04-17 SURGERY — REPAIR, TENDON, FLEXOR
Anesthesia: General | Site: Hand | Laterality: Left

## 2015-04-17 MED ORDER — OXYCODONE HCL 5 MG PO TABS
5.0000 mg | ORAL_TABLET | Freq: Once | ORAL | Status: AC
  Administered 2015-04-17: 5 mg via ORAL

## 2015-04-17 MED ORDER — PROPOFOL 10 MG/ML IV BOLUS
INTRAVENOUS | Status: DC | PRN
  Administered 2015-04-17: 300 mg via INTRAVENOUS

## 2015-04-17 MED ORDER — CEFAZOLIN SODIUM-DEXTROSE 2-3 GM-% IV SOLR
INTRAVENOUS | Status: AC
  Filled 2015-04-17: qty 50

## 2015-04-17 MED ORDER — CEFAZOLIN SODIUM-DEXTROSE 2-3 GM-% IV SOLR
2.0000 g | INTRAVENOUS | Status: AC
  Administered 2015-04-17: 2 g via INTRAVENOUS

## 2015-04-17 MED ORDER — FENTANYL CITRATE (PF) 100 MCG/2ML IJ SOLN: 25.0000 ug | INTRAMUSCULAR | Status: DC | PRN

## 2015-04-17 MED ORDER — CHLORHEXIDINE GLUCONATE 4 % EX LIQD: 60.0000 mL | Freq: Once | CUTANEOUS | Status: DC

## 2015-04-17 MED ORDER — FENTANYL CITRATE (PF) 100 MCG/2ML IJ SOLN
50.0000 ug | INTRAMUSCULAR | Status: DC | PRN
  Administered 2015-04-17: 50 ug via INTRAVENOUS

## 2015-04-17 MED ORDER — MIDAZOLAM HCL 2 MG/2ML IJ SOLN
INTRAMUSCULAR | Status: AC
  Filled 2015-04-17: qty 2

## 2015-04-17 MED ORDER — MIDAZOLAM HCL 2 MG/2ML IJ SOLN
1.0000 mg | INTRAMUSCULAR | Status: DC | PRN
  Administered 2015-04-17: 2 mg via INTRAVENOUS

## 2015-04-17 MED ORDER — GLYCOPYRROLATE 0.2 MG/ML IJ SOLN: 0.2000 mg | Freq: Once | INTRAMUSCULAR | Status: DC | PRN

## 2015-04-17 MED ORDER — FENTANYL CITRATE (PF) 100 MCG/2ML IJ SOLN
INTRAMUSCULAR | Status: AC
  Filled 2015-04-17: qty 2

## 2015-04-17 MED ORDER — LIDOCAINE HCL (CARDIAC) 20 MG/ML IV SOLN
INTRAVENOUS | Status: DC | PRN
  Administered 2015-04-17: 80 mg via INTRAVENOUS

## 2015-04-17 MED ORDER — LIDOCAINE HCL (CARDIAC) 20 MG/ML IV SOLN
INTRAVENOUS | Status: AC
  Filled 2015-04-17: qty 5

## 2015-04-17 MED ORDER — BUPIVACAINE HCL (PF) 0.25 % IJ SOLN
INTRAMUSCULAR | Status: DC | PRN
  Administered 2015-04-17: 7 mg

## 2015-04-17 MED ORDER — OXYCODONE HCL 5 MG PO TABS
ORAL_TABLET | ORAL | Status: AC
  Filled 2015-04-17: qty 1

## 2015-04-17 MED ORDER — DEXAMETHASONE SODIUM PHOSPHATE 10 MG/ML IJ SOLN
INTRAMUSCULAR | Status: DC | PRN
  Administered 2015-04-17: 10 mg via INTRAVENOUS

## 2015-04-17 MED ORDER — OXYCODONE-ACETAMINOPHEN 5-325 MG PO TABS: ORAL_TABLET | ORAL | Status: DC

## 2015-04-17 MED ORDER — LACTATED RINGERS IV SOLN
INTRAVENOUS | Status: DC
  Administered 2015-04-17: 16:00:00 via INTRAVENOUS
  Administered 2015-04-17: 10 mL/h via INTRAVENOUS

## 2015-04-17 MED ORDER — DEXAMETHASONE SODIUM PHOSPHATE 10 MG/ML IJ SOLN
INTRAMUSCULAR | Status: AC
  Filled 2015-04-17: qty 1

## 2015-04-17 MED ORDER — ONDANSETRON HCL 4 MG/2ML IJ SOLN
INTRAMUSCULAR | Status: DC | PRN
  Administered 2015-04-17: 4 mg via INTRAVENOUS

## 2015-04-17 MED ORDER — ONDANSETRON HCL 4 MG/2ML IJ SOLN
INTRAMUSCULAR | Status: AC
  Filled 2015-04-17: qty 2

## 2015-04-17 MED ORDER — SCOPOLAMINE 1 MG/3DAYS TD PT72: 1.0000 | MEDICATED_PATCH | Freq: Once | TRANSDERMAL | Status: DC

## 2015-04-17 MED ORDER — ONDANSETRON HCL 4 MG/2ML IJ SOLN: 4.0000 mg | Freq: Once | INTRAMUSCULAR | Status: DC | PRN

## 2015-04-17 SURGICAL SUPPLY — 78 items
BAG DECANTER FOR FLEXI CONT (MISCELLANEOUS) IMPLANT
BANDAGE ELASTIC 3 VELCRO ST LF (GAUZE/BANDAGES/DRESSINGS) ×2 IMPLANT
BLADE MINI RND TIP GREEN BEAV (BLADE) IMPLANT
BLADE SURG 15 STRL LF DISP TIS (BLADE) ×2 IMPLANT
BLADE SURG 15 STRL SS (BLADE) ×2
BNDG CONFORM 2 STRL LF (GAUZE/BANDAGES/DRESSINGS) IMPLANT
BNDG ELASTIC 2 VLCR STRL LF (GAUZE/BANDAGES/DRESSINGS) IMPLANT
BNDG ESMARK 4X9 LF (GAUZE/BANDAGES/DRESSINGS) ×2 IMPLANT
BNDG GAUZE ELAST 4 BULKY (GAUZE/BANDAGES/DRESSINGS) IMPLANT
CATH ROBINSON RED A/P 10FR (CATHETERS) IMPLANT
CHLORAPREP W/TINT 26ML (MISCELLANEOUS) ×2 IMPLANT
CORDS BIPOLAR (ELECTRODE) ×2 IMPLANT
COTTONBALL LRG STERILE PKG (GAUZE/BANDAGES/DRESSINGS) IMPLANT
COVER BACK TABLE 60X90IN (DRAPES) ×2 IMPLANT
COVER MAYO STAND STRL (DRAPES) ×2 IMPLANT
CUFF TOURNIQUET SINGLE 18IN (TOURNIQUET CUFF) ×2 IMPLANT
DECANTER SPIKE VIAL GLASS SM (MISCELLANEOUS) IMPLANT
DRAIN TLS ROUND 10FR (DRAIN) IMPLANT
DRAPE EXTREMITY T 121X128X90 (DRAPE) ×2 IMPLANT
DRAPE OEC MINIVIEW 54X84 (DRAPES) IMPLANT
DRAPE SURG 17X23 STRL (DRAPES) ×2 IMPLANT
DRSG PAD ABDOMINAL 8X10 ST (GAUZE/BANDAGES/DRESSINGS) IMPLANT
GAUZE SPONGE 4X4 12PLY STRL (GAUZE/BANDAGES/DRESSINGS) ×2 IMPLANT
GAUZE SPONGE 4X4 16PLY XRAY LF (GAUZE/BANDAGES/DRESSINGS) IMPLANT
GAUZE XEROFORM 1X8 LF (GAUZE/BANDAGES/DRESSINGS) ×2 IMPLANT
GLOVE BIO SURGEON STRL SZ7.5 (GLOVE) ×2 IMPLANT
GLOVE BIOGEL PI IND STRL 7.0 (GLOVE) ×1 IMPLANT
GLOVE BIOGEL PI IND STRL 8 (GLOVE) ×1 IMPLANT
GLOVE BIOGEL PI IND STRL 8.5 (GLOVE) IMPLANT
GLOVE BIOGEL PI INDICATOR 7.0 (GLOVE) ×1
GLOVE BIOGEL PI INDICATOR 8 (GLOVE) ×1
GLOVE BIOGEL PI INDICATOR 8.5 (GLOVE)
GLOVE ECLIPSE 6.5 STRL STRAW (GLOVE) ×2 IMPLANT
GLOVE SURG ORTHO 8.0 STRL STRW (GLOVE) ×2 IMPLANT
GOWN STRL REUS W/ TWL LRG LVL3 (GOWN DISPOSABLE) ×1 IMPLANT
GOWN STRL REUS W/TWL LRG LVL3 (GOWN DISPOSABLE) ×1
GOWN STRL REUS W/TWL XL LVL3 (GOWN DISPOSABLE) ×2 IMPLANT
K-WIRE .035X4 (WIRE) IMPLANT
LOOP VESSEL MAXI BLUE (MISCELLANEOUS) IMPLANT
NEEDLE HYPO 25X1 1.5 SAFETY (NEEDLE) IMPLANT
NEEDLE KEITH (NEEDLE) IMPLANT
NS IRRIG 1000ML POUR BTL (IV SOLUTION) ×2 IMPLANT
PACK BASIN DAY SURGERY FS (CUSTOM PROCEDURE TRAY) ×2 IMPLANT
PAD CAST 3X4 CTTN HI CHSV (CAST SUPPLIES) ×1 IMPLANT
PAD CAST 4YDX4 CTTN HI CHSV (CAST SUPPLIES) IMPLANT
PADDING CAST ABS 3INX4YD NS (CAST SUPPLIES)
PADDING CAST ABS 4INX4YD NS (CAST SUPPLIES) ×1
PADDING CAST ABS COTTON 3X4 (CAST SUPPLIES) IMPLANT
PADDING CAST ABS COTTON 4X4 ST (CAST SUPPLIES) ×1 IMPLANT
PADDING CAST COTTON 3X4 STRL (CAST SUPPLIES) ×1
PADDING CAST COTTON 4X4 STRL (CAST SUPPLIES)
SLEEVE SCD COMPRESS KNEE MED (MISCELLANEOUS) ×2 IMPLANT
SPLINT PLASTER CAST XFAST 3X15 (CAST SUPPLIES) IMPLANT
SPLINT PLASTER CAST XFAST 4X15 (CAST SUPPLIES) IMPLANT
SPLINT PLASTER XTRA FAST SET 4 (CAST SUPPLIES)
SPLINT PLASTER XTRA FASTSET 3X (CAST SUPPLIES)
STOCKINETTE 4X48 STRL (DRAPES) ×2 IMPLANT
SUT CHROMIC 5 0 P 3 (SUTURE) IMPLANT
SUT ETHIBOND 3-0 V-5 (SUTURE) IMPLANT
SUT ETHILON 3 0 PS 1 (SUTURE) IMPLANT
SUT ETHILON 4 0 PS 2 18 (SUTURE) ×2 IMPLANT
SUT FIBERWIRE 4-0 18 DIAM BLUE (SUTURE)
SUT MERSILENE 2.0 SH NDLE (SUTURE) IMPLANT
SUT MERSILENE 4 0 P 3 (SUTURE) IMPLANT
SUT POLY BUTTON 15MM (SUTURE) IMPLANT
SUT PROLENE 2 0 SH DA (SUTURE) IMPLANT
SUT PROLENE 5 0 P 3 (SUTURE) IMPLANT
SUT SILK 4 0 PS 2 (SUTURE) IMPLANT
SUT SUPRAMID 4-0 (SUTURE) IMPLANT
SUT VIC AB 4-0 P-3 18XBRD (SUTURE) IMPLANT
SUT VIC AB 4-0 P3 18 (SUTURE)
SUT VICRYL 4-0 PS2 18IN ABS (SUTURE) IMPLANT
SUTURE FIBERWR 4-0 18 DIA BLUE (SUTURE) IMPLANT
SYR BULB 3OZ (MISCELLANEOUS) ×2 IMPLANT
SYR CONTROL 10ML LL (SYRINGE) ×2 IMPLANT
TOWEL OR 17X24 6PK STRL BLUE (TOWEL DISPOSABLE) ×4 IMPLANT
TUBE FEEDING 5FR 15 INCH (TUBING) IMPLANT
UNDERPAD 30X30 (UNDERPADS AND DIAPERS) ×2 IMPLANT

## 2015-04-17 NOTE — H&P (Signed)
  Ronald Gardner is an 59 y.o. male.   Chief Complaint: left long finger flexor tendon laceration HPI: Ronald Gardner is a 59 year old right hand dominant male who states that just over two weeks ago he punctured the left long finger at the volar aspect of the middle phalanx while cutting meat. He did not seek immediate medical. The wound is healed over. He notes some inability to flex at the DIP joint of the long finger. He has had no previous injuries to the finger. No other injuries at this time.   Past Medical History  Diagnosis Date  . Prostatitis   . Injury of flexor tendon of left hand     left long finger    Past Surgical History  Procedure Laterality Date  . No prior surgeries      Family History  Problem Relation Age of Onset  . Heart disease Father   . Colon cancer Neg Hx   . Stomach cancer Neg Hx   . Rectal cancer Neg Hx   . Colon polyps Neg Hx    Social History:  reports that he has never smoked. He has never used smokeless tobacco. He reports that he drinks about 0.6 oz of alcohol per week. He reports that he does not use illicit drugs.  Allergies: No Known Allergies  Medications Prior to Admission  Medication Sig Dispense Refill  . ibuprofen (ADVIL,MOTRIN) 200 MG tablet Take 600 mg by mouth as needed.    . zolpidem (AMBIEN) 10 MG tablet Take 1 tablet (10 mg total) by mouth at bedtime as needed for sleep. 15 tablet 1    No results found for this or any previous visit (from the past 48 hour(s)).  No results found.   A comprehensive review of systems was negative.  Blood pressure 114/79, pulse 65, temperature 98 F (36.7 C), temperature source Oral, resp. rate 20, height 5\' 10"  (1.778 m), weight 73.936 kg (163 lb), SpO2 100 %.  General appearance: alert, cooperative and appears stated age Head: Normocephalic, without obvious abnormality, atraumatic Neck: supple, symmetrical, trachea midline Resp: clear to auscultation bilaterally Cardio: regular rate and  rhythm GI: non tender Extremities: non tender Pulses: 2+ and symmetric Skin: Skin color, texture, turgor normal. No rashes or lesions Neurologic: Grossly normal Incision/Wound: None  Assessment/Plan Left long finger flexor tendon laceration.  Non operative and operative treatment options were discussed with the patient and patient wishes to proceed with operative treatment. Risks, benefits, and alternatives of surgery were discussed and the patient agrees with the plan of care.   Delfina Schreurs R 04/17/2015, 2:53 PM

## 2015-04-17 NOTE — Anesthesia Postprocedure Evaluation (Signed)
Anesthesia Post Note  Patient: Ronald Gardner  Procedure(s) Performed: Procedure(s) (LRB): LEFT LONG FINGER FLEXOR TENDON REPAIR POSSIBLE GRAFT (Left)  Patient location during evaluation: PACU Anesthesia Type: General Level of consciousness: awake and alert Pain management: pain level controlled Vital Signs Assessment: post-procedure vital signs reviewed and stable Respiratory status: spontaneous breathing, nonlabored ventilation, respiratory function stable and patient connected to nasal cannula oxygen Cardiovascular status: blood pressure returned to baseline and stable Postop Assessment: no signs of nausea or vomiting Anesthetic complications: no    Last Vitals:  Filed Vitals:   04/17/15 1645 04/17/15 1700  BP: 116/74 130/93  Pulse: 66 72  Temp:    Resp: 9 13    Last Pain:  Filed Vitals:   04/17/15 1703  PainSc: Asleep                 Damary Doland JENNETTE

## 2015-04-17 NOTE — Op Note (Signed)
[id number] 

## 2015-04-17 NOTE — Anesthesia Procedure Notes (Signed)
Procedure Name: LMA Insertion Date/Time: 04/17/2015 3:36 PM Performed by: Baxter Flattery Pre-anesthesia Checklist: Patient identified, Emergency Drugs available, Suction available and Patient being monitored Patient Re-evaluated:Patient Re-evaluated prior to inductionOxygen Delivery Method: Circle System Utilized Preoxygenation: Pre-oxygenation with 100% oxygen Intubation Type: IV induction Ventilation: Mask ventilation without difficulty LMA: LMA inserted LMA Size: 4.5 Number of attempts: 1 Airway Equipment and Method: Bite block Placement Confirmation: positive ETCO2 and breath sounds checked- equal and bilateral Tube secured with: Tape Dental Injury: Teeth and Oropharynx as per pre-operative assessment

## 2015-04-17 NOTE — Discharge Instructions (Addendum)

## 2015-04-17 NOTE — Brief Op Note (Signed)
04/17/2015  4:36 PM  PATIENT:  Ronald Gardner  59 y.o. male  PRE-OPERATIVE DIAGNOSIS:  LEFT LONG FLEXOR TENDON LACERATION  POST-OPERATIVE DIAGNOSIS:  LEFT LONG FDP and FDS FLEXOR TENDON LACERATION  PROCEDURE:  Procedure(s): LEFT LONG FINGER FLEXOR TENDON REPAIR POSSIBLE GRAFT (Left)  SURGEON:  Surgeon(s) and Role:    * Leanora Cover, MD - Primary  PHYSICIAN ASSISTANT:   ASSISTANTS: Daryll Brod, MD   ANESTHESIA:   general  EBL:  Total I/O In: 1000 [I.V.:1000] Out: -   BLOOD ADMINISTERED:none  DRAINS: none   LOCAL MEDICATIONS USED:  MARCAINE     SPECIMEN:  No Specimen  DISPOSITION OF SPECIMEN:  N/A  COUNTS:  YES  TOURNIQUET:   Total Tourniquet Time Documented: Upper Arm (Left) - 49 minutes Total: Upper Arm (Left) - 49 minutes   DICTATION: .Other Dictation: Dictation Number [id number]  PLAN OF CARE: Discharge to home after PACU  PATIENT DISPOSITION:  PACU - hemodynamically stable.

## 2015-04-17 NOTE — Anesthesia Preprocedure Evaluation (Signed)
Anesthesia Evaluation  Patient identified by MRN, date of birth, ID band Patient awake    Reviewed: Allergy & Precautions, NPO status , Patient's Chart, lab work & pertinent test results  History of Anesthesia Complications Negative for: history of anesthetic complications  Airway Mallampati: II  TM Distance: >3 FB Neck ROM: Full    Dental no notable dental hx. (+) Dental Advisory Given   Pulmonary neg pulmonary ROS,    Pulmonary exam normal breath sounds clear to auscultation       Cardiovascular negative cardio ROS Normal cardiovascular exam Rhythm:Regular Rate:Normal     Neuro/Psych negative neurological ROS  negative psych ROS   GI/Hepatic negative GI ROS, Neg liver ROS,   Endo/Other  negative endocrine ROS  Renal/GU negative Renal ROS  negative genitourinary   Musculoskeletal negative musculoskeletal ROS (+)   Abdominal   Peds negative pediatric ROS (+)  Hematology negative hematology ROS (+)   Anesthesia Other Findings   Reproductive/Obstetrics negative OB ROS                             Anesthesia Physical Anesthesia Plan  ASA: II  Anesthesia Plan: General   Post-op Pain Management:    Induction: Intravenous  Airway Management Planned: LMA  Additional Equipment:   Intra-op Plan:   Post-operative Plan: Extubation in OR  Informed Consent: I have reviewed the patients History and Physical, chart, labs and discussed the procedure including the risks, benefits and alternatives for the proposed anesthesia with the patient or authorized representative who has indicated his/her understanding and acceptance.   Dental advisory given  Plan Discussed with: CRNA  Anesthesia Plan Comments:         Anesthesia Quick Evaluation

## 2015-04-17 NOTE — Transfer of Care (Signed)
Immediate Anesthesia Transfer of Care Note  Patient: Ronald Gardner  Procedure(s) Performed: Procedure(s): LEFT LONG FINGER FLEXOR TENDON REPAIR POSSIBLE GRAFT (Left)  Patient Location: PACU  Anesthesia Type:General  Level of Consciousness: awake and patient cooperative  Airway & Oxygen Therapy: Patient Spontanous Breathing and Patient connected to face mask oxygen  Post-op Assessment: Report given to RN and Post -op Vital signs reviewed and stable  Post vital signs: Reviewed and stable  Last Vitals:  Filed Vitals:   04/17/15 1321  BP: 114/79  Pulse: 65  Temp: 36.7 C  Resp: 20    Complications: No apparent anesthesia complications

## 2015-04-18 ENCOUNTER — Encounter (HOSPITAL_BASED_OUTPATIENT_CLINIC_OR_DEPARTMENT_OTHER): Payer: Self-pay | Admitting: Orthopedic Surgery

## 2015-04-18 NOTE — Op Note (Signed)
NAME:  Ronald Gardner, Ronald Gardner NO.:  [id number]  MEDICAL RECORD NO.:  [id number]  LOCATION:                                 FACILITY:  PHYSICIAN:  Leanora Cover, MD             DATE OF BIRTH:  DATE OF PROCEDURE:  04/17/2015 DATE OF DISCHARGE:                              OPERATIVE REPORT   PREOPERATIVE DIAGNOSIS:  Left long finger flexor tendon laceration.  POSTOPERATIVE DIAGNOSIS:  Left long finger flexor digitorum profundus and flexor digitorum superficialis zone 2 lacerations.  PROCEDURE:   1. Left long finger repair of flexor digitorum profundus zone 2 laceration 2. Left long finger repair of flexor digitorum superficialis zone 2 laceration.  SURGEON:  Leanora Cover, MD  ASSISTANT:  Daryll Brod, MD.  ANESTHESIA:  General.  IV FLUIDS:  Per anesthesia flow sheet.  ESTIMATED BLOOD LOSS:  Minimal.  COMPLICATIONS:  None.  SPECIMENS:  None.  TOURNIQUET TIME:  49 minutes.  DISPOSITION:  Stable to PACU.  INDICATIONS:  Mr. Dworak is a 59 year old right-hand dominant male who approximately 2 weeks ago injured his left long finger while cutting in his kitchen with the knife.  The poked into the long finger.  He has had inability to flex at the DIP joint.  He presented to the office approximately 1-1/2 weeks to 2 weeks after his injury.  We discussed the nature of flexor tendon injuries.  I recommended operative repair. Risks, benefits and alternatives of the surgery were discussed including the risk of blood loss; infection; damage to nerves, vessels, tendons, ligaments, bone; failure of surgery; need for additional surgery; complications with wound healing; continued pain; stiffness and weakness.  He voiced understanding of these risks and elected to proceed.  OPERATIVE COURSE:  After being identified preoperatively by myself, the patient and I agreed upon the procedure and site of procedure.  Surgical site was marked.  The risks, benefits and alternatives of  surgery were reviewed, and he wished to proceed.  Surgical consent had been signed. He was given IV Ancef as preoperative antibiotic prophylaxis.  He was transferred to the operating room and placed on the operating room table in supine position with the left upper extremity on an armboard. General anesthesia was induced by anesthesiologist.  The left upper extremity was prepped and draped in normal sterile orthopedic fashion. A surgical pause was performed between the surgeons, anesthesia and operating room staff, and all were in agreement as to the patient, procedure and site of procedure.  Tourniquet at the proximal aspect of the extremity was inflated to 250 mmHg after exsanguination of the limb with an Esmarch bandage.  Incision was made at the volar aspect of the finger.  This is a burning-type incision and coursed from the distal palm into the middle phalanx.  This was carried into subcutaneous tissues by spreading technique.  Bipolar electrocautery was used to obtain hemostasis.  The laceration had been over the proximal phalanx. There was a small rent in the flexor sheath.  The sheath was released. The A2 and A4 pulleys were preserved.  There was 100% laceration of the FDP tendon and almost 100% laceration of the ulnar  arm of the FDS tendon.  The ends of the FDP tendon were able to be retrieved and brought back over the area of the laceration.  The proximal end was somewhat mop-ended and this was freshened with a new 15-blade.  The FDS tendon arm was repaired with a 6-0 Prolene in a running fashion.  A running epitendinous 6-0 Prolene was placed on the back wall of the FDP tendon.  A looped Supramid suture was then passed in a modified Kessler technique as a core suture.  This was tied and the 6-0 Prolene running epitendinous suture was continued on the volar aspect of the FDP tendon. This provided good apposition of all tendon ends.  The finger was placed through a range of  motion.  The sheath was vented to allow good coursing of the repair site through the sheath.  Full extension was able to be obtained.  The wound was copiously irrigated with sterile saline and closed with 4-0 nylon in a horizontal mattress fashion.  A digital block was performed with approximately 7 mL of 0.25% plain Marcaine to aid in postoperative analgesia.  It was then dressed with sterile Xeroform, 4x4s, and wrapped with a Kerlix bandage.  A dorsal blocking splint was placed with the wrist flexed approximately 30 degrees, the MPs flexed and the IPs extended.  This was wrapped with Kerlix and Ace bandage. Tourniquet was deflated at 49 minutes.  Fingertips were pink with brisk capillary refill after deflation of the tourniquet.  Operative drapes were broken down and the patient was awoken from anesthesia safely.  He was transferred back to the stretcher and taken to PACU in stable condition.  I will see him back in the office in 1 week for postoperative followup.  I will give him Percocet 5/325, 1-2 p.o. q.6 hours p.r.n. pain, dispensed #40.     Leanora Cover, MD     KK/MEDQ  D:  04/17/2015  T:  04/18/2015  Job:  [id number]

## 2015-08-14 ENCOUNTER — Telehealth: Payer: Self-pay | Admitting: Internal Medicine

## 2015-08-14 MED ORDER — ZOLPIDEM TARTRATE 10 MG PO TABS: 10.0000 mg | ORAL_TABLET | Freq: Every evening | ORAL | Status: DC | PRN

## 2015-08-14 NOTE — Telephone Encounter (Signed)
Pt needs refill on ambien 10 mg #30 send to [facility] [address]

## 2015-08-14 NOTE — Telephone Encounter (Signed)
Left message on voicemail Rx called into pharmacy as requested.

## 2015-10-16 ENCOUNTER — Other Ambulatory Visit: Payer: Self-pay | Admitting: Orthopedic Surgery

## 2015-10-17 ENCOUNTER — Other Ambulatory Visit: Payer: Self-pay | Admitting: Orthopedic Surgery

## 2015-10-17 DIAGNOSIS — S66123D Laceration of flexor muscle, fascia and tendon of left middle finger at wrist and hand level, subsequent encounter: Secondary | ICD-10-CM

## 2015-10-28 ENCOUNTER — Inpatient Hospital Stay: Admission: RE | Admit: 2015-10-28 | Payer: BLUE CROSS/BLUE SHIELD | Source: Ambulatory Visit

## 2015-10-28 ENCOUNTER — Ambulatory Visit
Admission: RE | Admit: 2015-10-28 | Discharge: 2015-10-28 | Disposition: A | Discharge status: Home / Self Care | Payer: BLUE CROSS/BLUE SHIELD | Source: Ambulatory Visit | Attending: Orthopedic Surgery | Admitting: Orthopedic Surgery

## 2015-10-28 DIAGNOSIS — S66123D Laceration of flexor muscle, fascia and tendon of left middle finger at wrist and hand level, subsequent encounter: Secondary | ICD-10-CM

## 2015-11-17 ENCOUNTER — Telehealth: Payer: Self-pay | Admitting: Internal Medicine

## 2015-11-17 NOTE — Telephone Encounter (Signed)
Pt would like to have medication for high altitude will be gone for 8 days and leaving 7/16 from [place].

## 2015-11-17 NOTE — Telephone Encounter (Signed)
Please see message and advise 

## 2015-11-18 MED ORDER — ACETAZOLAMIDE 125 MG PO TABS: 125.0000 mg | ORAL_TABLET | Freq: Two times a day (BID) | ORAL | Status: DC

## 2015-11-18 NOTE — Telephone Encounter (Signed)
Please call in a prescription for generic Diamox 125 mg #40 1 twice a day starting 2 days prior to ascend to high altitude  Note.  This is a prescription for both patients

## 2015-11-18 NOTE — Telephone Encounter (Signed)
Left message to call office. Rx called into pharmacy.

## 2015-11-18 NOTE — Telephone Encounter (Signed)
Pt's wife notified.

## 2015-12-26 ENCOUNTER — Telehealth: Payer: Self-pay | Admitting: Internal Medicine

## 2015-12-26 NOTE — Telephone Encounter (Signed)
Error/ltd ° °

## 2016-02-17 ENCOUNTER — Telehealth: Payer: Self-pay | Admitting: Internal Medicine

## 2016-02-17 NOTE — Telephone Encounter (Signed)
Pt would like another referral to a different hand specialist. The surgery he had last year did not go that well and would like another opinion. Pt aware Dr Raliegh Ip is out this week

## 2016-02-19 ENCOUNTER — Telehealth: Payer: Self-pay | Admitting: Internal Medicine

## 2016-02-19 NOTE — Telephone Encounter (Signed)
Pt request refill  zolpidem (AMBIEN) 10 MG tablet  [facility] [address]

## 2016-02-23 MED ORDER — ZOLPIDEM TARTRATE 10 MG PO TABS: 10.0000 mg | ORAL_TABLET | Freq: Every evening | ORAL | 1 refills | Status: DC | PRN

## 2016-02-23 NOTE — Telephone Encounter (Signed)
Rx called in to pharmacy. 

## 2016-02-23 NOTE — Telephone Encounter (Signed)
Okay to refer to a different hand specialist

## 2016-02-23 NOTE — Telephone Encounter (Signed)
Discussed pt with Ronald Gardner she will send new referral to Charol[facility] Eke and [facility] for pt.

## 2016-02-23 NOTE — Telephone Encounter (Signed)
Please see message and advise 

## 2016-03-18 ENCOUNTER — Other Ambulatory Visit (INDEPENDENT_AMBULATORY_CARE_PROVIDER_SITE_OTHER): Payer: BLUE CROSS/BLUE SHIELD

## 2016-03-18 DIAGNOSIS — Z0000 Encounter for general adult medical examination without abnormal findings: Secondary | ICD-10-CM

## 2016-03-18 LAB — TSH: TSH: 1.44 u[IU]/mL (ref 0.35–4.50)

## 2016-03-18 LAB — BASIC METABOLIC PANEL
BUN: 11 mg/dL (ref 6–23)
CO2: 32 mEq/L (ref 19–32)
CREATININE: 0.9 mg/dL (ref 0.40–1.50)
Calcium: 9.2 mg/dL (ref 8.4–10.5)
Chloride: 100 mEq/L (ref 96–112)
GFR: 91.29 mL/min (ref >60.00)
GLUCOSE: 88 mg/dL (ref 70–99)
POTASSIUM: 3.8 meq/L (ref 3.5–5.1)
Sodium: 138 mEq/L (ref 135–145)

## 2016-03-18 LAB — LIPID PANEL
CHOLESTEROL: 188 mg/dL (ref 0–200)
HDL: 56.2 mg/dL (ref >39.00)
LDL CALC: 115 mg/dL — AB (ref 0–99)
NonHDL: 131.57
TRIGLYCERIDES: 82 mg/dL (ref 0.0–149.0)
Total CHOL/HDL Ratio: 3
VLDL: 16.4 mg/dL (ref 0.0–40.0)

## 2016-03-18 LAB — CBC WITH DIFFERENTIAL/PLATELET
Basophils Absolute: 0 10*3/uL (ref 0.0–0.1)
Basophils Relative: 0.2 % (ref 0.0–3.0)
EOS ABS: 0.1 10*3/uL (ref 0.0–0.7)
EOS PCT: 0.7 % (ref 0.0–5.0)
HCT: 43.4 % (ref 39.0–52.0)
Hemoglobin: 14.5 g/dL (ref 13.0–17.0)
LYMPHS ABS: 1.6 10*3/uL (ref 0.7–4.0)
Lymphocytes Relative: 20.6 % (ref 12.0–46.0)
MCHC: 33.4 g/dL (ref 30.0–36.0)
MCV: 91.9 fl (ref 78.0–100.0)
MONO ABS: 0.6 10*3/uL (ref 0.1–1.0)
Monocytes Relative: 7.5 % (ref 3.0–12.0)
NEUTROS PCT: 71 % (ref 43.0–77.0)
Neutro Abs: 5.4 10*3/uL (ref 1.4–7.7)
Platelets: 286 10*3/uL (ref 150.0–400.0)
RBC: 4.73 Mil/uL (ref 4.22–5.81)
RDW: 13.2 % (ref 11.5–15.5)
WBC: 7.6 10*3/uL (ref 4.0–10.5)

## 2016-03-18 LAB — HEPATIC FUNCTION PANEL
ALT: 15 U/L (ref 0–53)
AST: 14 U/L (ref 0–37)
Albumin: 4 g/dL (ref 3.5–5.2)
Alkaline Phosphatase: 58 U/L (ref 39–117)
BILIRUBIN TOTAL: 0.6 mg/dL (ref 0.2–1.2)
Bilirubin, Direct: 0.1 mg/dL (ref 0.0–0.3)
Total Protein: 6.3 g/dL (ref 6.0–8.3)

## 2016-03-18 LAB — POC URINALSYSI DIPSTICK (AUTOMATED)
BILIRUBIN UA: NEGATIVE
Blood, UA: NEGATIVE
GLUCOSE UA: NEGATIVE
Ketones, UA: NEGATIVE
LEUKOCYTES UA: NEGATIVE
NITRITE UA: NEGATIVE
Protein, UA: NEGATIVE
Spec Grav, UA: 1.01
Urobilinogen, UA: 0.2
pH, UA: 7.5

## 2016-03-18 LAB — PSA: PSA: 0.62 ng/mL (ref 0.10–4.00)

## 2016-04-13 ENCOUNTER — Ambulatory Visit (INDEPENDENT_AMBULATORY_CARE_PROVIDER_SITE_OTHER): Payer: BLUE CROSS/BLUE SHIELD | Admitting: Internal Medicine

## 2016-04-13 ENCOUNTER — Encounter: Payer: Self-pay | Admitting: Internal Medicine

## 2016-04-13 VITALS — BP 124/70 | HR 74 | Temp 98.1°F | Ht 69.5 in | Wt 168.0 lb

## 2016-04-13 DIAGNOSIS — Z0000 Encounter for general adult medical examination without abnormal findings: Secondary | ICD-10-CM | POA: Diagnosis not present

## 2016-04-13 MED ORDER — ZOLPIDEM TARTRATE 10 MG PO TABS: 10.0000 mg | ORAL_TABLET | Freq: Every evening | ORAL | 1 refills | Status: DC | PRN

## 2016-04-13 NOTE — Progress Notes (Signed)
Pre visit review using our clinic review tool, if applicable. No additional management support is needed unless otherwise documented below in the visit note. 

## 2016-04-13 NOTE — Patient Instructions (Addendum)
Earwax Buildup Your ears make a substance called earwax. It may also be called cerumen. Sometimes, too much earwax builds up in your ear canal. This can cause ear pain and make it harder for you to hear. CAUSES This condition is caused by too much earwax production or buildup. RISK FACTORS The following factors may make you more likely to develop this condition:  Cleaning your ears often with swabs.  Having narrow ear canals.  Having earwax that is overly thick or sticky.  Having eczema.  Being dehydrated. SYMPTOMS Symptoms of this condition include:  Reduced hearing.  Ear drainage.  Ear pain.  Ear itch.  A feeling of fullness in the ear or feeling that the ear is plugged.  Ringing in the ear.  Coughing. DIAGNOSIS Your health care provider can diagnose this condition based on your symptoms and medical history. Your health care provider will also do an ear exam to look inside your ear with a scope (otoscope). You may also have a hearing test. TREATMENT Treatment for this condition includes:  Over-the-counter or prescription ear drops to soften the earwax.  Earwax removal by a health care provider. This may be done:  By flushing the ear with body-temperature water.  With a medical instrument that has a loop at the end (earwax curette).  With a suction device. HOME CARE INSTRUCTIONS  Take over-the-counter and prescription medicines only as told by your health care provider.  Do not put any objects, including an ear swab, into your ear. You can clean the opening of your ear canal with a washcloth.  Drink enough water to keep your urine clear or pale yellow.  If you have frequent earwax buildup or you use hearing aids, consider seeing your health care provider every 6-12 months for routine preventive ear cleanings. Keep all follow-up visits as told by your health care provider. SEEK MEDICAL CARE IF:  You have ear pain.  Your condition does not improve with  treatment.  You have hearing loss.  You have blood, pus, or other fluid coming from your ear. This information is not intended to replace advice given to you by your health care provider. Make sure you discuss any questions you have with your health care provider. Document Released: 06/03/2004 Document Revised: 08/18/2015 Document Reviewed: 12/11/2014 Elsevier Interactive Patient Education  2017 [place] Maintenance, Male A healthy lifestyle and preventative care can promote health and wellness.  Maintain regular health, dental, and eye exams.  Eat a healthy diet. Foods like vegetables, fruits, whole grains, low-fat dairy products, and lean protein foods contain the nutrients you need and are low in calories. Decrease your intake of foods high in solid fats, added sugars, and salt. Get information about a proper diet from your health care provider, if necessary.  Regular physical exercise is one of the most important things you can do for your health. Most adults should get at least 150 minutes of moderate-intensity exercise (any activity that increases your heart rate and causes you to sweat) each week. In addition, most adults need muscle-strengthening exercises on 2 or more days a week.   Maintain a healthy weight. The body mass index (BMI) is a screening tool to identify possible weight problems. It provides an estimate of body fat based on height and weight. Your health care provider can find your BMI and can help you achieve or maintain a healthy weight. For males 20 years and older:  A BMI below 18.5 is considered underweight.  A BMI of  18.5 to 24.9 is normal.  A BMI of 25 to 29.9 is considered overweight.  A BMI of 30 and above is considered obese.  Maintain normal blood lipids and cholesterol by exercising and minimizing your intake of saturated fat. Eat a balanced diet with plenty of fruits and vegetables. Blood tests for lipids and cholesterol should begin at age  52 and be repeated every 5 years. If your lipid or cholesterol levels are high, you are over age 59, or you are at high risk for heart disease, you may need your cholesterol levels checked more frequently.Ongoing high lipid and cholesterol levels should be treated with medicines if diet and exercise are not working.  If you smoke, find out from your health care provider how to quit. If you do not use tobacco, do not start.  Lung cancer screening is recommended for adults aged 36-80 years who are at high risk for developing lung cancer because of a history of smoking. A yearly low-dose CT scan of the lungs is recommended for people who have at least a 30-pack-year history of smoking and are current smokers or have quit within the past 15 years. A pack year of smoking is smoking an average of 1 pack of cigarettes a day for 1 year (for example, a 30-pack-year history of smoking could mean smoking 1 pack a day for 30 years or 2 packs a day for 15 years). Yearly screening should continue until the smoker has stopped smoking for at least 15 years. Yearly screening should be stopped for people who develop a health problem that would prevent them from having lung cancer treatment.  If you choose to drink alcohol, do not have more than 2 drinks per day. One drink is considered to be 12 oz (360 mL) of beer, 5 oz (150 mL) of wine, or 1.5 oz (45 mL) of liquor.  Avoid the use of street drugs. Do not share needles with anyone. Ask for help if you need support or instructions about stopping the use of drugs.  High blood pressure causes heart disease and increases the risk of stroke. High blood pressure is more likely to develop in:  People who have blood pressure in the end of the normal range (100-139/85-89 mm Hg).  People who are overweight or obese.  People who are African American.  If you are 70-62 years of age, have your blood pressure checked every 3-5 years. If you are 18 years of age or older, have your  blood pressure checked every year. You should have your blood pressure measured twice-once when you are at a hospital or clinic, and once when you are not at a hospital or clinic. Record the average of the two measurements. To check your blood pressure when you are not at a hospital or clinic, you can use:  An automated blood pressure machine at a pharmacy.  A home blood pressure monitor.  If you are 30-35 years old, ask your health care provider if you should take aspirin to prevent heart disease.  Diabetes screening involves taking a blood sample to check your fasting blood sugar level. This should be done once every 3 years after age 68 if you are at a normal weight and without risk factors for diabetes. Testing should be considered at a younger age or be carried out more frequently if you are overweight and have at least 1 risk factor for diabetes.  Colorectal cancer can be detected and often prevented. Most routine colorectal cancer screening begins  at the age of 74 and continues through age 60. However, your health care provider may recommend screening at an earlier age if you have risk factors for colon cancer. On a yearly basis, your health care provider may provide home test kits to check for hidden blood in the stool. A small camera at the end of a tube may be used to directly examine the colon (sigmoidoscopy or colonoscopy) to detect the earliest forms of colorectal cancer. Talk to your health care provider about this at age 88 when routine screening begins. A direct exam of the colon should be repeated every 5-10 years through age 53, unless early forms of precancerous polyps or small growths are found.  People who are at an increased risk for hepatitis B should be screened for this virus. You are considered at high risk for hepatitis B if:  You were born in a country where hepatitis B occurs often. Talk with your health care provider about which countries are considered high risk.  Your  parents were born in a high-risk country and you have not received a shot to protect against hepatitis B (hepatitis B vaccine).  You have HIV or AIDS.  You use needles to inject street drugs.  You live with, or have sex with, someone who has hepatitis B.  You are a man who has sex with other men (MSM).  You get hemodialysis treatment.  You take certain medicines for conditions like cancer, organ transplantation, and autoimmune conditions.  Hepatitis C blood testing is recommended for all people born from [birth year] through 1965 and any individual with known risk factors for hepatitis C.  Healthy men should no longer receive prostate-specific antigen (PSA) blood tests as part of routine cancer screening. Talk to your health care provider about prostate cancer screening.  Testicular cancer screening is not recommended for adolescents or adult males who have no symptoms. Screening includes self-exam, a health care provider exam, and other screening tests. Consult with your health care provider about any symptoms you have or any concerns you have about testicular cancer.  Practice safe sex. Use condoms and avoid high-risk sexual practices to reduce the spread of sexually transmitted infections (STIs).  You should be screened for STIs, including gonorrhea and chlamydia if:  You are sexually active and are younger than 24 years.  You are older than 24 years, and your health care provider tells you that you are at risk for this type of infection.  Your sexual activity has changed since you were last screened, and you are at an increased risk for chlamydia or gonorrhea. Ask your health care provider if you are at risk.  If you are at risk of being infected with HIV, it is recommended that you take a prescription medicine daily to prevent HIV infection. This is called pre-exposure prophylaxis (PrEP). You are considered at risk if:  You are a man who has sex with other men (MSM).  You are a  heterosexual man who is sexually active with multiple partners.  You take drugs by injection.  You are sexually active with a partner who has HIV.  Talk with your health care provider about whether you are at high risk of being infected with HIV. If you choose to begin PrEP, you should first be tested for HIV. You should then be tested every 3 months for as long as you are taking PrEP.  Use sunscreen. Apply sunscreen liberally and repeatedly throughout the day. You should seek shade when your shadow  is shorter than you. Protect yourself by wearing long sleeves, pants, a wide-brimmed hat, and sunglasses year round whenever you are outdoors.  Tell your health care provider of new moles or changes in moles, especially if there is a change in shape or color. Also, tell your health care provider if a mole is larger than the size of a pencil eraser.  A one-time screening for abdominal aortic aneurysm (AAA) and surgical repair of large AAAs by ultrasound is recommended for men aged 39-75 years who are current or former smokers.  Stay current with your vaccines (immunizations). This information is not intended to replace advice given to you by your health care provider. Make sure you discuss any questions you have with your health care provider. Document Released: 10/23/2007 Document Revised: 05/17/2014 Document Reviewed: 01/28/2015 Elsevier Interactive Patient Education  2017 [employer].

## 2016-04-13 NOTE — Progress Notes (Signed)
Subjective:    Patient ID: Ronald Gardner, male    DOB: 06-13-1955, 60 y.o.   MRN: [id number]  HPI 60 year old patient who is seen today for a preventive health examination.  Doing quite well.   Allergies (verified):  No Known Drug Allergies   Past History:  Past Medical History:   single admission for abdominal pain, and required upper endoscopy  Colonic polyps, hx of   Past Surgical History:  Surgical repair of a left third finger tendon rupture unremarkable  Colonoscopy 2007  2013  Family History:  Reviewed history from 04/15/2008 and no changes required.  father age 5  history of coronary artery disease, possibly valvular heart disease  mother, age 31 . 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  HENT: Negative for congestion, dental problem, ear pain, hearing loss, mouth sores, rhinorrhea, sinus pressure, sneezing, tinnitus, trouble swallowing and voice change.   Eyes: Negative for photophobia, pain, redness and visual disturbance.  Respiratory: Negative for apnea, cough, choking, chest tightness, shortness of breath and wheezing.   Cardiovascular: Negative for chest pain, palpitations and leg swelling.  Gastrointestinal: Negative for abdominal distention, abdominal pain, anal bleeding, blood in stool, constipation, diarrhea, nausea, rectal pain and vomiting.  Genitourinary: Negative for decreased urine volume, difficulty urinating, discharge, dysuria, flank pain, frequency, genital sores, hematuria, penile swelling, scrotal swelling, testicular pain and urgency.  Musculoskeletal: Negative for arthralgias, back pain, gait problem, joint swelling, myalgias, neck pain  and neck stiffness.       Unable to flex left third DIP joint  Skin: Negative for color change, rash and wound.  Neurological: Negative for dizziness, tremors, seizures, syncope, facial asymmetry, speech difficulty, weakness, light-headedness, numbness and headaches.  Hematological: Negative for adenopathy. Does not bruise/bleed easily.  Psychiatric/Behavioral: Negative for agitation, behavioral problems, confusion, decreased concentration, dysphoric mood, hallucinations, self-injury, sleep disturbance and suicidal ideas. The patient is not nervous/anxious.        Objective:   Physical Exam  Constitutional: He appears well-developed and well-nourished.  HENT:  Head: Normocephalic and atraumatic.  Right Ear: External ear normal.  Left Ear: External ear normal.  Nose: Nose normal.  Mouth/Throat: Oropharynx is clear and moist.  Cerumen left canal  Eyes: Conjunctivae and EOM are normal. Pupils are equal, round, and reactive to light. No scleral icterus.  Neck: Normal range of motion. Neck supple. No JVD present. No thyromegaly present.  Cardiovascular: Regular rhythm, normal heart sounds and intact distal pulses.  Exam reveals no gallop and no friction rub.   No murmur heard. Pulmonary/Chest: Effort normal and breath sounds normal. He exhibits no tenderness.  Abdominal: Soft. Bowel sounds are normal. He exhibits no distension and no mass. There is no tenderness.  Genitourinary: Prostate normal and penis normal. Rectal exam shows guaiac negative stool.  Musculoskeletal: Normal range of motion. He exhibits no edema or tenderness.  Unable to flex left DIP joint  Lymphadenopathy:    He has no cervical adenopathy.  Neurological: He is alert. He has normal reflexes. No cranial nerve deficit. Coordination normal.  Skin: Skin is warm and dry. No rash noted.  Psychiatric: He has a normal mood and affect. His behavior is normal.  Assessment & Plan:   Preventive health  exam Left-sided cerumen impaction History colonic polyps.  Will need follow-up colonoscopy in one year Flexor tendon rupture, left third finger.  Follow-up hand surgery  Return in one year for follow-up  Nyoka Cowden

## 2016-08-30 ENCOUNTER — Telehealth: Payer: Self-pay | Admitting: Internal Medicine

## 2016-08-30 NOTE — Telephone Encounter (Signed)
Pt need new Rx for zolpidem  Pt is aware of 3 business days for refills and someone will give he a call when ready for pickup.

## 2016-08-30 NOTE — Telephone Encounter (Signed)
Okay for refill?  

## 2016-08-30 NOTE — Telephone Encounter (Signed)
Filled last on 05/13/16 for 2 months Last seen 04/13/16 for yearly No future appointment scheduled. Please advise.  Thanks!!

## 2016-08-31 MED ORDER — ZOLPIDEM TARTRATE 10 MG PO TABS: 10.0000 mg | ORAL_TABLET | Freq: Every evening | ORAL | 0 refills | Status: DC | PRN

## 2016-08-31 NOTE — Telephone Encounter (Signed)
Medication refilled #30 with 0 refills.

## 2016-10-20 ENCOUNTER — Encounter: Payer: Self-pay | Admitting: Internal Medicine

## 2016-11-08 ENCOUNTER — Other Ambulatory Visit: Payer: Self-pay | Admitting: Internal Medicine

## 2017-01-11 ENCOUNTER — Encounter: Payer: Self-pay | Admitting: Internal Medicine

## 2017-01-12 DIAGNOSIS — M7742 Metatarsalgia, left foot: Secondary | ICD-10-CM | POA: Diagnosis not present

## 2017-01-12 DIAGNOSIS — M7741 Metatarsalgia, right foot: Secondary | ICD-10-CM | POA: Diagnosis not present

## 2017-01-13 DIAGNOSIS — M7741 Metatarsalgia, right foot: Secondary | ICD-10-CM | POA: Diagnosis not present

## 2017-01-13 DIAGNOSIS — M7742 Metatarsalgia, left foot: Secondary | ICD-10-CM | POA: Diagnosis not present

## 2017-01-17 ENCOUNTER — Telehealth: Payer: Self-pay | Admitting: Internal Medicine

## 2017-01-17 ENCOUNTER — Other Ambulatory Visit: Payer: Self-pay | Admitting: Internal Medicine

## 2017-01-17 DIAGNOSIS — M7741 Metatarsalgia, right foot: Secondary | ICD-10-CM | POA: Diagnosis not present

## 2017-01-17 DIAGNOSIS — M7742 Metatarsalgia, left foot: Secondary | ICD-10-CM | POA: Diagnosis not present

## 2017-01-17 NOTE — Telephone Encounter (Signed)
Pt request refill  zolpidem (AMBIEN) 10 MG tablet   [id number] - [place], [place] - [address] AT [address]

## 2017-01-19 DIAGNOSIS — M7741 Metatarsalgia, right foot: Secondary | ICD-10-CM | POA: Diagnosis not present

## 2017-01-19 DIAGNOSIS — M7742 Metatarsalgia, left foot: Secondary | ICD-10-CM | POA: Diagnosis not present

## 2017-01-26 DIAGNOSIS — M7741 Metatarsalgia, right foot: Secondary | ICD-10-CM | POA: Diagnosis not present

## 2017-01-26 DIAGNOSIS — M7742 Metatarsalgia, left foot: Secondary | ICD-10-CM | POA: Diagnosis not present

## 2017-03-04 ENCOUNTER — Ambulatory Visit (AMBULATORY_SURGERY_CENTER): Payer: Self-pay | Admitting: *Deleted

## 2017-03-04 VITALS — Ht 70.5 in | Wt 166.0 lb

## 2017-03-04 DIAGNOSIS — Z86010 Personal history of colonic polyps: Secondary | ICD-10-CM

## 2017-03-04 MED ORDER — NA SULFATE-K SULFATE-MG SULF 17.5-3.13-1.6 GM/177ML PO SOLN: 1.0000 | Freq: Once | ORAL | 0 refills | Status: AC

## 2017-03-04 NOTE — Progress Notes (Signed)
No egg or soy allergy known to patient  No issues with past sedation with any surgeries  or procedures, no intubation problems  No diet pills per patient No home 02 use per patient  No blood thinners per patient  Pt denies issues with constipation  No A fib or A flutter  EMMI video sent to pt's e mail pt declined   

## 2017-03-14 ENCOUNTER — Encounter: Payer: Self-pay | Admitting: Internal Medicine

## 2017-03-14 ENCOUNTER — Ambulatory Visit (INDEPENDENT_AMBULATORY_CARE_PROVIDER_SITE_OTHER): Payer: BLUE CROSS/BLUE SHIELD | Admitting: Internal Medicine

## 2017-03-14 ENCOUNTER — Telehealth: Payer: Self-pay | Admitting: Internal Medicine

## 2017-03-14 VITALS — BP 132/86 | HR 81 | Temp 98.0°F | Ht 70.5 in | Wt 166.6 lb

## 2017-03-14 DIAGNOSIS — Z125 Encounter for screening for malignant neoplasm of prostate: Secondary | ICD-10-CM

## 2017-03-14 DIAGNOSIS — Z23 Encounter for immunization: Secondary | ICD-10-CM

## 2017-03-14 DIAGNOSIS — Z0000 Encounter for general adult medical examination without abnormal findings: Secondary | ICD-10-CM

## 2017-03-14 LAB — COMPREHENSIVE METABOLIC PANEL
ALBUMIN: 4.4 g/dL (ref 3.5–5.2)
ALK PHOS: 55 U/L (ref 39–117)
ALT: 14 U/L (ref 0–53)
AST: 14 U/L (ref 0–37)
BILIRUBIN TOTAL: 0.8 mg/dL (ref 0.2–1.2)
BUN: 16 mg/dL (ref 6–23)
CALCIUM: 9.6 mg/dL (ref 8.4–10.5)
CO2: 31 meq/L (ref 19–32)
CREATININE: 0.98 mg/dL (ref 0.40–1.50)
Chloride: 100 mEq/L (ref 96–112)
GFR: 82.48 mL/min (ref >60.00)
Glucose, Bld: 101 mg/dL — ABNORMAL HIGH (ref 70–99)
Potassium: 3.8 mEq/L (ref 3.5–5.1)
Sodium: 138 mEq/L (ref 135–145)
Total Protein: 6.8 g/dL (ref 6.0–8.3)

## 2017-03-14 LAB — TSH: TSH: 1.9 u[IU]/mL (ref 0.35–4.50)

## 2017-03-14 LAB — CBC WITH DIFFERENTIAL/PLATELET
BASOS ABS: 0.1 10*3/uL (ref 0.0–0.1)
Basophils Relative: 0.8 % (ref 0.0–3.0)
EOS ABS: 0.1 10*3/uL (ref 0.0–0.7)
Eosinophils Relative: 1.1 % (ref 0.0–5.0)
HEMATOCRIT: 47.3 % (ref 39.0–52.0)
Hemoglobin: 15.5 g/dL (ref 13.0–17.0)
LYMPHS PCT: 22.8 % (ref 12.0–46.0)
Lymphs Abs: 1.4 10*3/uL (ref 0.7–4.0)
MCHC: 32.8 g/dL (ref 30.0–36.0)
MCV: 94.4 fl (ref 78.0–100.0)
MONOS PCT: 8.9 % (ref 3.0–12.0)
Monocytes Absolute: 0.6 10*3/uL (ref 0.1–1.0)
NEUTROS ABS: 4.1 10*3/uL (ref 1.4–7.7)
NEUTROS PCT: 66.4 % (ref 43.0–77.0)
PLATELETS: 245 10*3/uL (ref 150.0–400.0)
RBC: 5.01 Mil/uL (ref 4.22–5.81)
RDW: 12.7 % (ref 11.5–15.5)
WBC: 6.2 10*3/uL (ref 4.0–10.5)

## 2017-03-14 LAB — LIPID PANEL
CHOLESTEROL: 194 mg/dL (ref 0–200)
HDL: 62.5 mg/dL (ref >39.00)
LDL Cholesterol: 116 mg/dL — ABNORMAL HIGH (ref 0–99)
NonHDL: 131.52
TRIGLYCERIDES: 76 mg/dL (ref 0.0–149.0)
Total CHOL/HDL Ratio: 3
VLDL: 15.2 mg/dL (ref 0.0–40.0)

## 2017-03-14 LAB — PSA: PSA: 0.58 ng/mL (ref 0.10–4.00)

## 2017-03-14 NOTE — Patient Instructions (Signed)
Colonoscopy as scheduled    It is important that you exercise regularly, at least 20 minutes 3 to 4 times per week.  If you develop chest pain or shortness of breath seek  medical attention.  Return in one year for follow-up

## 2017-03-14 NOTE — Telephone Encounter (Signed)
Patient's wife dropped off the Physician Results Form for her husband that was seen today.   Fax form to: [phone]  Disposition: Dr's folder

## 2017-03-14 NOTE — Progress Notes (Signed)
Subjective:    Patient ID: Ronald Gardner, male    DOB: [birth year], 61 y.o.   MRN: [id number]  HPI    He is scheduled for follow-up colonoscopy tomorrow.  Doing quite well without concerns or complaints.  Past Medical History:  Diagnosis Date  . Allergy   . Injury of flexor tendon of left hand    left long finger  . Prostatitis      Social History   Socioeconomic History  . Marital status: Single    Spouse name: Not on file  . Number of children: Not on file  . Years of education: Not on file  . Highest education level: Not on file  Social Needs  . Financial resource strain: Not on file  . Food insecurity - worry: Not on file  . Food insecurity - inability: Not on file  . Transportation needs - medical: Not on file  . Transportation needs - non-medical: Not on file  Occupational History  . Not on file  Tobacco Use  . Smoking status: Never Smoker  . Smokeless tobacco: Never Used  Substance and Sexual Activity  . Alcohol use: Yes    Alcohol/week: 0.6 oz    Types: 1 Glasses of wine per week    Comment: social  . Drug use: No  . Sexual activity: Not on file  Other Topics Concern  . Not on file  Social History Narrative  . Not on file    Past Surgical History:  Procedure Laterality Date  . CARPAL TUNNEL RELEASE Left   . COLONOSCOPY    . POLYPECTOMY      Family History  Problem Relation Age of Onset  . Heart disease Father   . Colon cancer Neg Hx   . Stomach cancer Neg Hx   . Rectal cancer Neg Hx   . Colon polyps Neg Hx   . Esophageal cancer Neg Hx     No Known Allergies  Current Outpatient Medications on File Prior to Visit  Medication Sig Dispense Refill  . ibuprofen (ADVIL,MOTRIN) 200 MG tablet Take 600 mg by mouth as needed.    . zolpidem (AMBIEN) 10 MG tablet TAKE 1 TABLET AT BEDTIME AS NEEDED FOR SLEEP 30 tablet 0   No current facility-administered medications on file prior to visit.     BP 132/86 (BP Location: Left Arm, Patient  Position: Sitting, Cuff Size: Normal)   Pulse 81   Temp 98 F (36.7 C) (Oral)   Ht 5' 10.5" (1.791 m)   Wt 166 lb 9.6 oz (75.6 kg)   SpO2 97%   BMI 23.57 kg/m     Review of Systems  Constitutional: Negative for appetite change, chills, fatigue and fever.  HENT: Negative for congestion, dental problem, ear pain, hearing loss, sore throat, tinnitus, trouble swallowing and voice change.   Eyes: Negative for pain, discharge and visual disturbance.  Respiratory: Negative for cough, chest tightness, wheezing and stridor.   Cardiovascular: Negative for chest pain, palpitations and leg swelling.  Gastrointestinal: Negative for abdominal distention, abdominal pain, blood in stool, constipation, diarrhea, nausea and vomiting.  Genitourinary: Negative for difficulty urinating, discharge, flank pain, genital sores, hematuria and urgency.  Musculoskeletal: Negative for arthralgias, back pain, gait problem, joint swelling, myalgias and neck stiffness.  Skin: Negative for rash.  Neurological: Negative for dizziness, syncope, speech difficulty, weakness, numbness and headaches.  Hematological: Negative for adenopathy. Does not bruise/bleed easily.  Psychiatric/Behavioral: Positive for sleep disturbance. Negative for behavioral problems and dysphoric mood.  The patient is not nervous/anxious.        Objective:   Physical Exam  Constitutional: He appears well-developed and well-nourished.  HENT:  Head: Normocephalic and atraumatic.  Right Ear: External ear normal.  Left Ear: External ear normal.  Nose: Nose normal.  Mouth/Throat: Oropharynx is clear and moist.  Cerumen in both canals  Eyes: Conjunctivae and EOM are normal. Pupils are equal, round, and reactive to light. No scleral icterus.  Neck: Normal range of motion. Neck supple. No JVD present. No thyromegaly present.  Cardiovascular: Regular rhythm, normal heart sounds and intact distal pulses. Exam reveals no gallop and no friction rub.    No murmur heard. Pulmonary/Chest: Effort normal and breath sounds normal. He exhibits no tenderness.  Abdominal: Soft. Bowel sounds are normal. He exhibits no distension and no mass. There is no tenderness.  Genitourinary: Prostate normal and penis normal.  Musculoskeletal: Normal range of motion. He exhibits no edema or tenderness.  Lymphadenopathy:    He has no cervical adenopathy.  Neurological: He is alert. He has normal reflexes. No cranial nerve deficit. Coordination normal.  Skin: Skin is warm and dry. No rash noted.  Psychiatric: He has a normal mood and affect. His behavior is normal.          Assessment & Plan:   Preventive health examination History of colonic polyps.  Follow-up colonoscopy in the morning  Check screening lab Flu vaccine administered  Follow-up one year or as needed  Nyoka Cowden

## 2017-03-15 ENCOUNTER — Encounter: Payer: Self-pay | Admitting: Internal Medicine

## 2017-03-15 ENCOUNTER — Ambulatory Visit (AMBULATORY_SURGERY_CENTER): Payer: BLUE CROSS/BLUE SHIELD | Admitting: Internal Medicine

## 2017-03-15 VITALS — BP 110/72 | HR 75 | Temp 97.3°F | Resp 18 | Ht 70.0 in | Wt 166.0 lb

## 2017-03-15 DIAGNOSIS — Z86010 Personal history of colonic polyps: Secondary | ICD-10-CM

## 2017-03-15 DIAGNOSIS — D122 Benign neoplasm of ascending colon: Secondary | ICD-10-CM

## 2017-03-15 HISTORY — PX: COLONOSCOPY: SHX174

## 2017-03-15 MED ORDER — SODIUM CHLORIDE 0.9 % IV SOLN: 500.0000 mL | INTRAVENOUS | Status: DC

## 2017-03-15 NOTE — Patient Instructions (Signed)
YOU HAD AN ENDOSCOPIC PROCEDURE TODAY AT [facility]:   Refer to the procedure report that was given to you for any specific questions about what was found during the examination.  If the procedure report does not answer your questions, please call your gastroenterologist to clarify.  If you requested that your care partner not be given the details of your procedure findings, then the procedure report has been included in a sealed envelope for you to review at your convenience later.  YOU SHOULD EXPECT: Some feelings of bloating in the abdomen. Passage of more gas than usual.  Walking can help get rid of the air that was put into your GI tract during the procedure and reduce the bloating. If you had a lower endoscopy (such as a colonoscopy or flexible sigmoidoscopy) you may notice spotting of blood in your stool or on the toilet paper. If you underwent a bowel prep for your procedure, you may not have a normal bowel movement for a few days.  Please Note:  You might notice some irritation and congestion in your nose or some drainage.  This is from the oxygen used during your procedure.  There is no need for concern and it should clear up in a day or so.  SYMPTOMS TO REPORT IMMEDIATELY:   Following lower endoscopy (colonoscopy or flexible sigmoidoscopy):  Excessive amounts of blood in the stool  Significant tenderness or worsening of abdominal pains  Swelling of the abdomen that is new, acute  Fever of 100F or higher   For urgent or emergent issues, a gastroenterologist can be reached at any hour by calling [phone].   DIET:  We do recommend a small meal at first, but then you may proceed to your regular diet.  Drink plenty of fluids but you should avoid alcoholic beverages for 24 hours.  ACTIVITY:  You should plan to take it easy for the rest of today and you should NOT DRIVE or use heavy machinery until tomorrow (because of the sedation medicines used during the test).     FOLLOW UP: Our staff will call the number listed on your records the next business day following your procedure to check on you and address any questions or concerns that you may have regarding the information given to you following your procedure. If we do not reach you, we will leave a message.  However, if you are feeling well and you are not experiencing any problems, there is no need to return our call.  We will assume that you have returned to your regular daily activities without incident.  If any biopsies were taken you will be contacted by phone or by letter within the next 1-3 weeks.  Please call us at [phone] if you have not heard about the biopsies in 3 weeks.    SIGNATURES/CONFIDENTIALITY: You and/or your care partner have signed paperwork which will be entered into your electronic medical record.  These signatures attest to the fact that that the information above on your After Visit Summary has been reviewed and is understood.  Full responsibility of the confidentiality of this discharge information lies with you and/or your care-partner.  Will will see you again in 5 years!

## 2017-03-15 NOTE — Progress Notes (Signed)
Called to room to assist during endoscopic procedure.  Patient ID and intended procedure confirmed with present staff. Received instructions for my participation in the procedure from the performing physician.  

## 2017-03-15 NOTE — Op Note (Signed)
[facility] Patient Name: Ronald Gardner Procedure Date: 03/15/2017 9:24 AM MRN: [id number] Endoscopist: Docia Chuck. Henrene Pastor , MD Age: 61 Referring MD:  Date of Birth: 05-05-1956 Gender: Male Account #: [id number] Procedure:                Colonoscopy, with cold snare polypectomy x 1 Indications:              High risk colon cancer surveillance: Personal                            history of non-advanced adenoma. Previous                            examinations 2007 and 2013 Medicines:                Monitored Anesthesia Care Procedure:                Pre-Anesthesia Assessment:                           - Prior to the procedure, a History and Physical                            was performed, and patient medications and                            allergies were reviewed. The patient's tolerance of                            previous anesthesia was also reviewed. The risks                            and benefits of the procedure and the sedation                            options and risks were discussed with the patient.                            All questions were answered, and informed consent                            was obtained. Prior Anticoagulants: The patient has                            taken no previous anticoagulant or antiplatelet                            agents. ASA Grade Assessment: II - A patient with                            mild systemic disease. After reviewing the risks                            and benefits, the patient was deemed in  satisfactory condition to undergo the procedure.                           After obtaining informed consent, the colonoscope                            was passed under direct vision. Throughout the                            procedure, the patient's blood pressure, pulse, and                            oxygen saturations were monitored continuously. The                            Colonoscope  was introduced through the anus and                            advanced to the the cecum, identified by                            appendiceal orifice and ileocecal valve. The                            ileocecal valve, appendiceal orifice, and rectum                            were photographed. The quality of the bowel                            preparation was excellent. The colonoscopy was                            performed without difficulty. The patient tolerated                            the procedure well. The bowel preparation used was                            SUPREP. Scope In: 9:35:05 AM Scope Out: 9:49:35 AM Scope Withdrawal Time: 0 hours 11 minutes 19 seconds  Total Procedure Duration: 0 hours 14 minutes 30 seconds  Findings:                 A 2 mm polyp was found in the proximal ascending                            colon. The polyp was removed with a cold snare.                            Resection and retrieval were complete.                           Internal hemorrhoids were found during retroflexion.  The exam was otherwise without abnormality on                            direct and retroflexion views. Complications:            No immediate complications. Estimated blood loss:                            None. Estimated Blood Loss:     Estimated blood loss: none. Impression:               - One 2 mm polyp in the proximal ascending colon,                            removed with a cold snare. Resected and retrieved.                           - Internal hemorrhoids.                           - The examination was otherwise normal on direct                            and retroflexion views. Recommendation:           - Repeat colonoscopy in 5 years for surveillance.                           - Patient has a contact number available for                            emergencies. The signs and symptoms of potential                            delayed  complications were discussed with the                            patient. Return to normal activities tomorrow.                            Written discharge instructions were provided to the                            patient.                           - Resume previous diet.                           - Continue present medications.                           - Await pathology results. Docia Chuck. Henrene Pastor, MD 03/15/2017 9:54:06 AM This report has been signed electronically.

## 2017-03-15 NOTE — Progress Notes (Signed)
Spontaneous respirations throughout. VSS. Resting comfortably. To PACU on room air. Report to  RN. 

## 2017-03-16 ENCOUNTER — Telehealth: Payer: Self-pay | Admitting: *Deleted

## 2017-03-16 NOTE — Telephone Encounter (Signed)
  Follow up Call-  Call back number 03/15/2017  Post procedure Call Back phone  # [phone]  Permission to leave phone message Yes  Some recent data might be hidden     Patient questions:  Do you have a fever, pain , or abdominal swelling? No. Pain Score  0 *  Have you tolerated food without any problems? Yes.    Have you been able to return to your normal activities? Yes.    Do you have any questions about your discharge instructions: Diet   No. Medications  No. Follow up visit  No.  Do you have questions or concerns about your Care? No.  Actions: * If pain score is 4 or above: No action needed, pain <4.

## 2017-03-17 ENCOUNTER — Encounter: Payer: Self-pay | Admitting: Internal Medicine

## 2017-03-28 ENCOUNTER — Telehealth: Payer: Self-pay | Admitting: Family Medicine

## 2017-03-28 NOTE — Telephone Encounter (Signed)
This paperwork was completed either same day or the following day and faxed to the insurance company.  Please asked patient to confirm this has been received.  If not, please obtain additional paperwork for completion to  refax

## 2017-03-28 NOTE — Telephone Encounter (Signed)
Have we submitted pt's paperwork? Please advise

## 2017-03-28 NOTE — Telephone Encounter (Signed)
Copied from [place] [phone]. Topic: Inquiry >> Mar 28, 2017 12:20 PM Patrice Paradise wrote: Reason for CRM: Pt wife calling to see if her husband ppwk for his blood work was submitted to his insurance company, ppwk was brought in the day of his appt on 03/15/17. Pt wife said she was in there a week after her husband and her ppwrk has been sent to their ins co, she rec'd confirmation they rec'd it. Please contact patient if ppwk has been submitted.

## 2017-03-29 ENCOUNTER — Other Ambulatory Visit: Payer: Self-pay | Admitting: Internal Medicine

## 2017-03-29 NOTE — Telephone Encounter (Signed)
Called pt to inform that form has been faxed.

## 2017-03-30 ENCOUNTER — Other Ambulatory Visit: Payer: Self-pay | Admitting: Internal Medicine

## 2017-03-30 MED ORDER — ZOLPIDEM TARTRATE 10 MG PO TABS: 10.0000 mg | ORAL_TABLET | Freq: Every evening | ORAL | 0 refills | Status: DC | PRN

## 2017-05-19 ENCOUNTER — Other Ambulatory Visit: Payer: Self-pay | Admitting: Internal Medicine

## 2017-07-26 ENCOUNTER — Other Ambulatory Visit: Payer: Self-pay | Admitting: Internal Medicine

## 2017-09-23 ENCOUNTER — Telehealth: Payer: Self-pay | Admitting: Family Medicine

## 2017-09-23 ENCOUNTER — Telehealth: Payer: Self-pay

## 2017-09-23 NOTE — Telephone Encounter (Signed)
Copied from [place] [phone]. Topic: Inquiry >> Sep 23, 2017  1:42 PM Pricilla Handler wrote: Reason for CRM: Patient wants to know why he has to come in for a RX refill. Patient wants Dr. Raliegh Ip or his assistant to call him back today at [phone].         Thank You!!!

## 2017-09-23 NOTE — Telephone Encounter (Signed)
Refill request for Zolpidem. Pt need to be seen for more refills.

## 2017-09-26 NOTE — Telephone Encounter (Signed)
Pt called in to follow up on his message sent on Friday. Pt would like a call back as soon as possible. Pt says that if he need an apt due to Dr. Raliegh Ip retiring he would like to switch to Dr. Jerline Pain at [facility]

## 2017-09-27 ENCOUNTER — Other Ambulatory Visit: Payer: Self-pay

## 2017-09-27 MED ORDER — ZOLPIDEM TARTRATE 10 MG PO TABS: 10.0000 mg | ORAL_TABLET | Freq: Every evening | ORAL | 0 refills | Status: DC | PRN

## 2017-09-28 NOTE — Telephone Encounter (Signed)
Left detailed message on pt vm. Informed pt that Rx has been faxed to [facility] and for him to call [facility] to establish care.

## 2017-11-23 ENCOUNTER — Other Ambulatory Visit: Payer: Self-pay | Admitting: Internal Medicine

## 2017-11-23 NOTE — Telephone Encounter (Signed)
Patient need to schedule an ov for more refills. Left vm for pt to return phone call.

## 2018-03-24 ENCOUNTER — Ambulatory Visit (INDEPENDENT_AMBULATORY_CARE_PROVIDER_SITE_OTHER): Payer: BLUE CROSS/BLUE SHIELD | Admitting: Family Medicine

## 2018-03-24 ENCOUNTER — Encounter: Payer: Self-pay | Admitting: Family Medicine

## 2018-03-24 VITALS — BP 104/78 | HR 66 | Temp 97.7°F | Resp 16 | Ht 70.0 in | Wt 167.0 lb

## 2018-03-24 DIAGNOSIS — Z0000 Encounter for general adult medical examination without abnormal findings: Secondary | ICD-10-CM | POA: Diagnosis not present

## 2018-03-24 DIAGNOSIS — Z23 Encounter for immunization: Secondary | ICD-10-CM

## 2018-03-24 DIAGNOSIS — Z86010 Personal history of colonic polyps: Secondary | ICD-10-CM | POA: Diagnosis not present

## 2018-03-24 DIAGNOSIS — Z125 Encounter for screening for malignant neoplasm of prostate: Secondary | ICD-10-CM | POA: Diagnosis not present

## 2018-03-24 DIAGNOSIS — E785 Hyperlipidemia, unspecified: Secondary | ICD-10-CM | POA: Diagnosis not present

## 2018-03-24 DIAGNOSIS — G5600 Carpal tunnel syndrome, unspecified upper limb: Secondary | ICD-10-CM | POA: Insufficient documentation

## 2018-03-24 DIAGNOSIS — G5602 Carpal tunnel syndrome, left upper limb: Secondary | ICD-10-CM

## 2018-03-24 DIAGNOSIS — G4700 Insomnia, unspecified: Secondary | ICD-10-CM

## 2018-03-24 LAB — CBC
HEMATOCRIT: 46.3 % (ref 39.0–52.0)
Hemoglobin: 15.5 g/dL (ref 13.0–17.0)
MCHC: 33.4 g/dL (ref 30.0–36.0)
MCV: 93.4 fl (ref 78.0–100.0)
Platelets: 263 10*3/uL (ref 150.0–400.0)
RBC: 4.96 Mil/uL (ref 4.22–5.81)
RDW: 13.4 % (ref 11.5–15.5)
WBC: 7.4 10*3/uL (ref 4.0–10.5)

## 2018-03-24 LAB — COMPREHENSIVE METABOLIC PANEL
ALBUMIN: 4.5 g/dL (ref 3.5–5.2)
ALT: 14 U/L (ref 0–53)
AST: 15 U/L (ref 0–37)
Alkaline Phosphatase: 52 U/L (ref 39–117)
BILIRUBIN TOTAL: 1.1 mg/dL (ref 0.2–1.2)
BUN: 13 mg/dL (ref 6–23)
CO2: 33 meq/L — AB (ref 19–32)
CREATININE: 0.96 mg/dL (ref 0.40–1.50)
Calcium: 9.5 mg/dL (ref 8.4–10.5)
Chloride: 100 mEq/L (ref 96–112)
GFR: 84.18 mL/min (ref >60.00)
Glucose, Bld: 96 mg/dL (ref 70–99)
Potassium: 4.1 mEq/L (ref 3.5–5.1)
SODIUM: 138 meq/L (ref 135–145)
Total Protein: 7 g/dL (ref 6.0–8.3)

## 2018-03-24 LAB — LIPID PANEL
CHOL/HDL RATIO: 3
Cholesterol: 197 mg/dL (ref 0–200)
HDL: 70.4 mg/dL (ref >39.00)
LDL CALC: 111 mg/dL — AB (ref 0–99)
NonHDL: 126.5
Triglycerides: 79 mg/dL (ref 0.0–149.0)
VLDL: 15.8 mg/dL (ref 0.0–40.0)

## 2018-03-24 LAB — PSA: PSA: 0.57 ng/mL (ref 0.10–4.00)

## 2018-03-24 MED ORDER — ZOLPIDEM TARTRATE 10 MG PO TABS
10.0000 mg | ORAL_TABLET | Freq: Every evening | ORAL | 1 refills | Status: DC | PRN
Start: 2018-03-24 — End: 2018-10-31

## 2018-03-24 NOTE — Progress Notes (Signed)
Phone: [phone]  Subjective:  Patient presents today to establish care with me as their new primary care provider. Patient was formerly a patient of Dr. Burnice Logan. Chief complaint-noted.   See problem oriented charting ROS- full  review of systems was completed and negative except for: poor sleep, some stress  The following were reviewed and entered/updated in epic: Past Medical History:  Diagnosis Date  . Allergy   . Carpal tunnel syndrome    left  . Injury of flexor tendon of left hand    left long finger. cleaning a fish- injury- cant bend DIP left 3rd finger  . Prostatitis    he feels like standing desk helps   Patient Active Problem List   Diagnosis Date Noted  . Insomnia 03/24/2018    Priority: Low  . Carpal tunnel syndrome     Priority: Low  . History of adenomatous polyp of colon 04/15/2008    Priority: Low   Past Surgical History:  Procedure Laterality Date  . CARPAL TUNNEL RELEASE Left   . COLONOSCOPY    . FLEXOR TENDON REPAIR Left 04/17/2015   Procedure: LEFT LONG FINGER FLEXOR TENDON REPAIR POSSIBLE GRAFT;  Surgeon: Leanora Cover, MD;  Location: [facility];  Service: Orthopedics;  Laterality: Left;  . POLYPECTOMY      Family History  Problem Relation Age of Onset  . Heart disease Father   . Pneumonia Father        died summer 2018. died at 62.   . Other Mother        wheelchair bound- overweight, never exercised- age 95  . Obesity Sister   . Hypertension Sister   . ALS Brother   . Obesity Sister   . Colon cancer Neg Hx   . Stomach cancer Neg Hx   . Rectal cancer Neg Hx   . Colon polyps Neg Hx   . Esophageal cancer Neg Hx    Medications- reviewed and updated Current Outpatient Medications  Medication Sig Dispense Refill  . zolpidem (AMBIEN) 10 MG tablet Take 1 tablet (10 mg total) by mouth at bedtime as needed. for sleep 30 tablet 0   Allergies-reviewed and updated No Known Allergies  Social History   Social History  Narrative   Married. 5 kids ranging form 19 to 29 in 2019.       Music therapist with [employer]      Hobbies: travel, fishing, walking, hiking, movies    Objective: BP 104/78   Pulse 66   Temp 97.7 F (36.5 C) (Oral)   Resp 16   Ht 5\' 10"  (1.778 m)   Wt 167 lb (75.8 kg)   SpO2 98%   BMI 23.96 kg/m  Gen: NAD, resting comfortably HEENT: Mucous membranes are moist. Oropharynx normal Neck: no thyromegaly CV: RRR no murmurs rubs or gallops Lungs: CTAB no crackles, wheeze, rhonchi Abdomen: soft/nontender/nondistended/normal bowel sounds. No rebound or guarding.  Ext: no edema Skin: warm, dry Neuro: grossly normal, moves all extremities, PERRLA  Assessment/Plan:  62 y.o. male presenting for annual physical.  Health Maintenance counseling: 1. Anticipatory guidance: Patient counseled regarding regular dental exams -q6 months, eye exams - every other yearly,  avoiding smoking and second hand smoke, limiting alcohol to 2 beverages per day .   2. Risk factor reduction:  Advised patient of need for regular exercise and diet rich and fruits and vegetables to reduce risk of heart attack and stroke. Exercise- [employer] 4 days a week. Diet-reasonable diet.  Wt Readings from Last 3 Encounters:  03/24/18 167 lb (75.8 kg)  03/15/17 166 lb (75.3 kg)  03/14/17 166 lb 9.6 oz (75.6 kg)  3. Immunizations/screenings/ancillary studies- flu and Tdap today Immunization History  Administered Date(s) Administered  . Influenza Whole 03/05/2009, 02/23/2010  . Influenza,inj,Quad PF,6+ Mos 04/09/2013, 04/09/2014, 04/07/2015, 03/14/2017, 03/24/2018  . Td 05/11/2007  . Tdap 03/24/2018   Health Maintenance Due  Topic Date Due  . Hepatitis C Screening - with labs today  [birth year]  . HIV Screening - screened before marriage 08/01/1970   4. Prostate cancer screening- will get PSA today. Defer rectal exam unless PSA trends up  Lab Results  Component Value Date   PSA 0.58 03/14/2017   PSA  0.62 03/18/2016   PSA 0.55 03/19/2015   5. Colon cancer screening -  adenoma 2018- plan repeat 2023 6. Skin cancer screening- dermatologist a few years ago. advised regular sunscreen use. Denies worrisome, changing, or new skin lesions.  7. never smoker  Status of chronic or acute concerns   Insomnia Sleep maintenance. If wakes up early in AM like 1-2 AM may take half of one to get back to sleep  Mild hyperlipidemia- update labs  Sugar slightly high last year- continue to monitor with lab sthis year  Future Appointments  Date Time Provider [place]  03/27/2019  8:00 AM Marin Olp, MD [facility]   Return in about 1 year (around 03/25/2019) for physical.  Lab/Order associations: Fasting Need for influenza vaccination - Plan: Flu Vaccine QUAD 6+ mos PF IM (Fluarix Quad PF)  Need for Tdap vaccination - Plan: Tdap vaccine greater than or equal to 7yo IM  History of adenomatous polyp of colon  Insomnia, unspecified type  Carpal tunnel syndrome of left wrist  Hyperlipidemia, unspecified hyperlipidemia type - Plan: CBC, Comprehensive metabolic panel, Lipid panel  Screening for prostate cancer - Plan: PSA  Controlled type 2 diabetes mellitus without complication, without long-term current use of insulin (HCC)  Meds ordered this encounter  Medications  . zolpidem (AMBIEN) 10 MG tablet    Sig: Take 1 tablet (10 mg total) by mouth at bedtime as needed. for sleep    Dispense:  30 tablet    Refill:  1    Not to exceed 5 additional fills before 11/16/2017   Return precautions advised.  Garret Reddish, MD

## 2018-03-24 NOTE — Assessment & Plan Note (Signed)
Sleep maintenance. If wakes up early in AM like 1-2 AM may take half of one to get back to sleep

## 2018-03-24 NOTE — Patient Instructions (Addendum)
Health Maintenance Due  Topic Date Due  . Hepatitis C Screening - screen today with labs 1955/06/12  . TETANUS/TDAP - today 05/10/2017  . INFLUENZA VACCINE - today 12/08/2017   Please stop by lab before you go

## 2018-04-10 ENCOUNTER — Telehealth: Payer: Self-pay | Admitting: Family Medicine

## 2018-04-10 NOTE — Telephone Encounter (Signed)
Patient came in office in reference to [facility] form he brought in on date of CPE 03/24/18. Pt is stating it is not showing that it has been submitted yet for him to be able to receive his credit. Please advise and call pt with update.

## 2018-04-26 NOTE — Telephone Encounter (Signed)
Per Roselyn Reef, form has been faxed and mailed original to pt.

## 2018-10-30 ENCOUNTER — Other Ambulatory Visit: Payer: Self-pay | Admitting: Family Medicine

## 2018-10-31 NOTE — Telephone Encounter (Signed)
Requesting refill for Ambien, Last OV 03/2018, last refill 03/24/2018 #30, refill x 1. Pt has an appt scheduled for 03/26/2020.

## 2018-12-07 ENCOUNTER — Other Ambulatory Visit: Payer: Self-pay

## 2018-12-07 ENCOUNTER — Ambulatory Visit (INDEPENDENT_AMBULATORY_CARE_PROVIDER_SITE_OTHER): Payer: BLUE CROSS/BLUE SHIELD | Admitting: Family Medicine

## 2018-12-07 DIAGNOSIS — H9202 Otalgia, left ear: Secondary | ICD-10-CM

## 2018-12-07 MED ORDER — OFLOXACIN 0.3 % OT SOLN: 10.0000 [drp] | Freq: Every day | OTIC | 0 refills | Status: DC

## 2018-12-07 NOTE — Progress Notes (Signed)
Virtual Visit via Telephone Note  I connected with Ronald Gardner on 12/07/18 at  3:40 PM EDT by telephone and verified that I am speaking with the correct person using two identifiers.   I discussed the limitations, risks, security and privacy concerns of performing an evaluation and management service by telephone and the availability of in person appointments. I also discussed with the patient that there may be a patient responsible charge related to this service. The patient expressed understanding and agreed to proceed.  Location patient: home Location provider: work or home office Participants present for the call: patient, provider Patient did not have a visit in the prior 7 days to address this/these issue(s).   History of Present Illness:   Acute visit for "possible swimmers ear": -was swimming last week -developed some L ear pain about 2-3 days ago -feels stopped up as well, ear canal hurts when he touches it -no redness or swelling around the ear that he is aware of -no fevers, sinus congestion, NVD, sinus congestion, drainage or blood from the ear -no hx of ear infections or ear tubes  Observations/Objective: Patient sounds cheerful and well on the phone. I do not appreciate any SOB. Speech and thought processing are grossly intact. Patient reported vitals:denies fever  Assessment and Plan:  Ear Pain:  -we discussed possible serious and likely etiologies, workup and treatment, treatment risks and precautions. Presumed possible otitis externa, did explain difficult to confirm with out exam.  -after this discussion, Ronald Gardner opted for empiric tx for otitis externa with prompt inperson eval if worsening, new concerns or not improving with treatment -follow up advised as needed   Follow Up Instructions:  See above  I did not refer this patient for an OV in the next 24 hours for this/these issue(s).  I discussed the assessment and treatment plan with the patient. The  patient was provided an opportunity to ask questions and all were answered. The patient agreed with the plan and demonstrated an understanding of the instructions.   The patient was advised to call back or seek an in-person evaluation if the symptoms worsen or if the condition fails to improve as anticipated.  I provided 11 minutes of non-face-to-face time during this encounter.   Lucretia Kern, DO

## 2019-02-08 DIAGNOSIS — Z20828 Contact with and (suspected) exposure to other viral communicable diseases: Secondary | ICD-10-CM | POA: Diagnosis not present

## 2019-03-27 ENCOUNTER — Ambulatory Visit (INDEPENDENT_AMBULATORY_CARE_PROVIDER_SITE_OTHER): Payer: BLUE CROSS/BLUE SHIELD | Admitting: Family Medicine

## 2019-03-27 ENCOUNTER — Other Ambulatory Visit: Payer: Self-pay

## 2019-03-27 ENCOUNTER — Encounter: Payer: Self-pay | Admitting: Family Medicine

## 2019-03-27 VITALS — BP 112/70 | HR 75 | Temp 97.3°F | Ht 70.0 in | Wt 172.8 lb

## 2019-03-27 DIAGNOSIS — Z23 Encounter for immunization: Secondary | ICD-10-CM | POA: Diagnosis not present

## 2019-03-27 DIAGNOSIS — G4700 Insomnia, unspecified: Secondary | ICD-10-CM

## 2019-03-27 DIAGNOSIS — Z125 Encounter for screening for malignant neoplasm of prostate: Secondary | ICD-10-CM | POA: Diagnosis not present

## 2019-03-27 DIAGNOSIS — Z1159 Encounter for screening for other viral diseases: Secondary | ICD-10-CM | POA: Diagnosis not present

## 2019-03-27 DIAGNOSIS — Z0000 Encounter for general adult medical examination without abnormal findings: Secondary | ICD-10-CM | POA: Diagnosis not present

## 2019-03-27 DIAGNOSIS — E785 Hyperlipidemia, unspecified: Secondary | ICD-10-CM | POA: Diagnosis not present

## 2019-03-27 DIAGNOSIS — Z86010 Personal history of colonic polyps: Secondary | ICD-10-CM

## 2019-03-27 LAB — CBC
HCT: 45.4 % (ref 39.0–52.0)
Hemoglobin: 15.3 g/dL (ref 13.0–17.0)
MCHC: 33.7 g/dL (ref 30.0–36.0)
MCV: 92.7 fl (ref 78.0–100.0)
Platelets: 231 10*3/uL (ref 150.0–400.0)
RBC: 4.9 Mil/uL (ref 4.22–5.81)
RDW: 13.4 % (ref 11.5–15.5)
WBC: 5.7 10*3/uL (ref 4.0–10.5)

## 2019-03-27 LAB — LIPID PANEL
Cholesterol: 196 mg/dL (ref 0–200)
HDL: 57.5 mg/dL (ref >39.00)
LDL Cholesterol: 118 mg/dL — ABNORMAL HIGH (ref 0–99)
NonHDL: 138.92
Total CHOL/HDL Ratio: 3
Triglycerides: 105 mg/dL (ref 0.0–149.0)
VLDL: 21 mg/dL (ref 0.0–40.0)

## 2019-03-27 LAB — COMPREHENSIVE METABOLIC PANEL
ALT: 28 U/L (ref 0–53)
AST: 23 U/L (ref 0–37)
Albumin: 4.2 g/dL (ref 3.5–5.2)
Alkaline Phosphatase: 61 U/L (ref 39–117)
BUN: 15 mg/dL (ref 6–23)
CO2: 29 mEq/L (ref 19–32)
Calcium: 9.4 mg/dL (ref 8.4–10.5)
Chloride: 101 mEq/L (ref 96–112)
Creatinine, Ser: 0.97 mg/dL (ref 0.40–1.50)
GFR: 78.01 mL/min (ref >60.00)
Glucose, Bld: 80 mg/dL (ref 70–99)
Potassium: 4.4 mEq/L (ref 3.5–5.1)
Sodium: 139 mEq/L (ref 135–145)
Total Bilirubin: 0.8 mg/dL (ref 0.2–1.2)
Total Protein: 6.6 g/dL (ref 6.0–8.3)

## 2019-03-27 LAB — PSA: PSA: 0.68 ng/mL (ref 0.10–4.00)

## 2019-03-27 NOTE — Addendum Note (Signed)
Addended by: Clyde Lundborg A on: 03/27/2019 09:00 AM   Modules accepted: Orders

## 2019-03-27 NOTE — Patient Instructions (Addendum)
Health Maintenance Due  Topic Date Due  . Hepatitis C Screening-ordered with labs today 04-Oct-1955   Shingrix #1 today. Repeat injection in 2-5 months. Schedule a nurse visit for the 2nd injection before you leave today (at the check out desk)  I have heard good things about [facility] and [facility]- if you do not see Dr. Delilah Shan  Recommended follow up: 1 year physical planned   Please stop by lab before you go If you do not have mychart- we will call you about results within 5 business days of [place] receiving them.  If you have mychart- we will send your results within 3 business days of [place] receiving them.  If abnormal or we want to clarify a result, we will call or mychart you to make sure you receive the message.  If you have questions or concerns or don't hear within 5-7 days, please send [place] a message or call us.

## 2019-03-27 NOTE — Progress Notes (Signed)
Phone: [phone]   Subjective:  Patient presents today for their annual physical. Chief complaint-noted.   See problem oriented charting- ROS- full  review of systems was completed and negative  except for: nocturia  The following were reviewed and entered/updated in epic: Past Medical History:  Diagnosis Date  . Allergy   . Carpal tunnel syndrome    left  . Injury of flexor tendon of left hand    left long finger. cleaning a fish- injury- cant bend DIP left 3rd finger  . Prostatitis    he feels like standing desk helps   Patient Active Problem List   Diagnosis Date Noted  . Insomnia 03/24/2018    Priority: Low  . Carpal tunnel syndrome     Priority: Low  . History of adenomatous polyp of colon 04/15/2008    Priority: Low   Past Surgical History:  Procedure Laterality Date  . CARPAL TUNNEL RELEASE Left   . COLONOSCOPY    . FLEXOR TENDON REPAIR Left 04/17/2015   Procedure: LEFT LONG FINGER FLEXOR TENDON REPAIR POSSIBLE GRAFT;  Surgeon: Leanora Cover, MD;  Location: [facility];  Service: Orthopedics;  Laterality: Left;  . POLYPECTOMY      Family History  Problem Relation Age of Onset  . Heart disease Father   . Pneumonia Father        died summer 2018. died at 60.   . Other Mother        wheelchair bound- overweight, never exercised- age 20  . Obesity Sister   . Hypertension Sister   . ALS Brother   . Obesity Sister   . Colon cancer Neg Hx   . Stomach cancer Neg Hx   . Rectal cancer Neg Hx   . Colon polyps Neg Hx   . Esophageal cancer Neg Hx     Medications- reviewed and updated Current Outpatient Medications  Medication Sig Dispense Refill  . zolpidem (AMBIEN) 10 MG tablet TAKE 1 TABLET (10 MG TOTAL) BY MOUTH AT BEDTIME AS NEEDED. FOR SLEEP 30 tablet 1   No current facility-administered medications for this visit.     Allergies-reviewed and updated No Known Allergies  Social History   Social History Narrative   Married. 5 kids  ranging form 19 to 29 in 2019.       Financial Advisor with [employer]      Hobbies: travel, fishing, walking, hiking, movies    Objective  Objective:  BP 112/70   Pulse 75   Temp (!) 97.3 F (36.3 C)   Ht 5\' 10"  (1.778 m)   Wt 172 lb 12.8 oz (78.4 kg)   SpO2 98%   BMI 24.79 kg/m  Gen: NAD, resting comfortably HEENT: Mask not removed due to covid 19. TM normal. Bridge of nose normal. Eyelids normal.  Neck: no thyromegaly or cervical lymphadenopathy  CV: RRR no murmurs rubs or gallops Lungs: CTAB no crackles, wheeze, rhonchi Abdomen: soft/nontender/nondistended/normal bowel sounds. No rebound or guarding.  Ext: no edema Skin: warm, dry Neuro: grossly normal, moves all extremities, PERRLA   Assessment and Plan  63 y.o. male presenting for annual physical.  Health Maintenance counseling: 1. Anticipatory guidance: Patient counseled regarding regular dental exams -q6 months, eye exams - every other yearly,  avoiding smoking and second hand smoke , limiting alcohol to 2 beverages per day .   2. Risk factor reduction:  Advised patient of need for regular exercise and diet rich and fruits and vegetables to reduce risk  of heart attack and stroke. Exercise- walking with wife and doing stretching videos and working in the yard. Diet- reasonably healthy diet- he is up a few lbs - he will continue to monitor but still in healthy range.  Wt Readings from Last 3 Encounters:  03/27/19 172 lb 12.8 oz (78.4 kg)  03/24/18 167 lb (75.8 kg)  03/15/17 166 lb (75.3 kg)  3. Immunizations/screenings/ancillary studies-discussed Shingrix today- opts inh.  Hepatitis C screening with labs Immunization History  Administered Date(s) Administered  . Influenza Inj Mdck Quad With Preservative 03/01/2019  . Influenza Whole 03/05/2009, 02/23/2010  . Influenza,inj,Quad PF,6+ Mos 04/09/2013, 04/09/2014, 04/07/2015, 03/14/2017, 03/24/2018  . Td 05/11/2007  . Tdap 03/24/2018   4. Prostate cancer  screening- low risk for PSA trend-update with labs today.  Defer rectal due to low risk trend. Nocturia 3x a night usually, good urinary flow Lab Results  Component Value Date   PSA 0.57 03/24/2018   PSA 0.58 03/14/2017   PSA 0.62 03/18/2016   5. Colon cancer screening - History of adenomatous polyp of colon and will be due for repeat colonoscopy in 2023-last colonoscopy March 15 2017 6. Skin cancer screening- he will consider getting updated dermatology visit. advised regular sunscreen use. Denies worrisome, changing, or new skin lesions.  7.  Never smoker  Status of chronic or acute concerns   Hyperlipidemia, unspecified hyperlipidemia type - Plan: CBC, Comprehensive metabolic panel, Lipid panel.  Update labs-we discussed newer more aggressive LDL goal. He would prefer to focus on diet/exercise and try to remain off statin if possible- will recalculate risk yearly.   Insomnia, unspecified type-no problem going to sleep but uses when wakes up and can get 2-3 hours of sleep. Tylenol pm actually helps him reasonably well. He does not need refill at present.  -nocturia wakes him up- 3-4 x a night  Recommended follow up: 1 year physical planned  Lab/Order associations: not fasting   ICD-10-CM   1. Preventative health care  Z00.00 CBC    Comprehensive metabolic panel    Lipid panel    PSA    Hepatitis C antibody  2. Hyperlipidemia, unspecified hyperlipidemia type  E78.5 CBC    Comprehensive metabolic panel    Lipid panel  3. History of adenomatous polyp of colon  Z86.010   4. Insomnia, unspecified type  G47.00   5. Screening for prostate cancer  Z12.5 PSA  6. Encounter for hepatitis C screening test for low risk patient  Z11.59 Hepatitis C antibody    No orders of the defined types were placed in this encounter.   Return precautions advised.  Garret Reddish, MD

## 2019-03-28 LAB — HEPATITIS C ANTIBODY
Hepatitis C Ab: NONREACTIVE
SIGNAL TO CUT-OFF: 0.02 (ref <1.00)

## 2019-04-18 ENCOUNTER — Other Ambulatory Visit: Payer: Self-pay | Admitting: Family Medicine

## 2019-04-23 NOTE — Telephone Encounter (Signed)
Pt calling to check status of refill

## 2019-04-23 NOTE — Telephone Encounter (Signed)
See note

## 2019-04-23 NOTE — Telephone Encounter (Signed)
Patient requesting a callback today as soon as possible. Patient stated that hasnt heard back from office in regards to his medication refill since last Wednesday. Please advise

## 2019-04-23 NOTE — Telephone Encounter (Signed)
Pt called in again to check on status of refill. Did notify patient they are working on it and has been sent to team. Pt states he keeps getting told the same thing and wants an answer as to when he will be getting this response. Please advise as pt states he will keep calling.

## 2019-04-24 NOTE — Telephone Encounter (Signed)
Please inform patient I did not receive request until today- I filled it once I received the request- sorry for the delay- im not sure why I didn't receive it last week.

## 2019-07-09 ENCOUNTER — Telehealth: Payer: Self-pay

## 2019-07-09 ENCOUNTER — Other Ambulatory Visit: Payer: Self-pay

## 2019-07-09 NOTE — Telephone Encounter (Signed)
Patient want to know would this shingles shot conflict with the covid vaccine if so the patient do not want to come into the office.Please return patient call.

## 2019-07-09 NOTE — Telephone Encounter (Signed)
Called and spoke with pt and he states he will proceed with the shingles shot because he does not have his covid vaccine scheduled.

## 2019-07-10 ENCOUNTER — Ambulatory Visit (INDEPENDENT_AMBULATORY_CARE_PROVIDER_SITE_OTHER): Payer: BLUE CROSS/BLUE SHIELD

## 2019-07-10 DIAGNOSIS — Z23 Encounter for immunization: Secondary | ICD-10-CM | POA: Diagnosis not present

## 2019-07-10 NOTE — Progress Notes (Signed)
Pt had #2 shingles vaccination in the office today.

## 2019-07-20 ENCOUNTER — Telehealth: Payer: Self-pay

## 2019-07-20 NOTE — Telephone Encounter (Signed)
Patient is scheduled to receive the covid vaccine 07/23/19 patient had shingles vaccine on 07/10/19 patient would like to know would it be okay to receive that covid vaccine. Please follow up with patient

## 2019-07-20 NOTE — Telephone Encounter (Signed)
Called and advised pt that typically you aren't supposed to receive any vaccines 14 days prior to or after the covid vaccine. Pt voiced understanding.

## 2019-10-31 ENCOUNTER — Other Ambulatory Visit: Payer: Self-pay | Admitting: Family Medicine

## 2019-10-31 NOTE — Telephone Encounter (Signed)
Patient called back is out of medications can you approve 1 refill.

## 2019-10-31 NOTE — Telephone Encounter (Signed)
Last refill: 04/24/19 #30,1  Last OV: 03/27/19 dx. CPE

## 2019-11-01 NOTE — Telephone Encounter (Signed)
JoEllen, Rx was sent in by Dr. Yong Channel.

## 2019-11-01 NOTE — Telephone Encounter (Signed)
Pt notified Rx was sent to pharmacy. 

## 2019-11-01 NOTE — Telephone Encounter (Signed)
Can you let me know when you answer this patient came in office upset wanting refill. Let him know I have sent that to another provider and will call him as soon as I get answer.

## 2019-11-01 NOTE — Telephone Encounter (Signed)
FYI

## 2019-12-13 ENCOUNTER — Encounter: Payer: Self-pay | Admitting: Family Medicine

## 2019-12-13 ENCOUNTER — Other Ambulatory Visit: Payer: Self-pay

## 2019-12-13 ENCOUNTER — Ambulatory Visit (INDEPENDENT_AMBULATORY_CARE_PROVIDER_SITE_OTHER): Payer: BC Managed Care – PPO | Admitting: Family Medicine

## 2019-12-13 VITALS — BP 111/76 | HR 77 | Temp 97.2°F | Ht 70.0 in | Wt 168.2 lb

## 2019-12-13 DIAGNOSIS — M26621 Arthralgia of right temporomandibular joint: Secondary | ICD-10-CM

## 2019-12-13 MED ORDER — DICLOFENAC SODIUM 75 MG PO TBEC: 75.0000 mg | DELAYED_RELEASE_TABLET | Freq: Two times a day (BID) | ORAL | 0 refills | Status: DC

## 2019-12-13 NOTE — Progress Notes (Signed)
° °  Ronald Gardner is a 64 y.o. male who presents today for an office visit.  Assessment/Plan:  TMJ arthralgia No red flags.  Start diclofenac 75 mg twice daily for the next 1 to 2 weeks.  Discussed home exercises and handout was given.  Discussed reasons to return to care.    Subjective:  HPI:  Patient here with right-sided jaw and ear Gardner for about a week.  No obvious precipitating events.  Symptoms have been worsening.  Has tried ibuprofen with modest improvement.  No fevers or chills.  Gardner worse with chewing and closing mouth.       Objective:  Physical Exam: BP 111/76    Pulse 77    Temp (!) 97.2 F (36.2 C)    Ht 5\' 10"  (1.778 m)    Wt 168 lb 3.2 oz (76.3 kg)    SpO2 97%    BMI 24.13 kg/m   Gen: No acute distress, resting comfortably HEENT: TMs clear bilaterally.  Right TMJ tender to palpation.  No crepitus.      Ronald Gardner. Ronald Pain, MD 12/13/2019 8:25 AM

## 2019-12-13 NOTE — Patient Instructions (Addendum)
It was very nice to see you today!  Please start the diclofenac.  Work on exercises.  Let us know if not improving.  Take care, Dr Jerline Pain  Please try these tips to maintain a healthy lifestyle:   Eat at least 3 REAL meals and 1-2 snacks per day.  Aim for no more than 5 hours between eating.  If you eat breakfast, please do so within one hour of getting up.    Each meal should contain half fruits/vegetables, one quarter protein, and one quarter carbs (no bigger than a computer mouse)   Cut down on sweet beverages. This includes juice, soda, and sweet tea.     Drink at least 1 glass of water with each meal and aim for at least 8 glasses per day  Exercise at least 150 minutes every week.    Temporomandibular Joint Syndrome  Temporomandibular joint syndrome (TMJ syndrome) is a condition that causes pain in the temporomandibular joints. These joints are located near your ears and allow your jaw to open and close. For people with TMJ syndrome, chewing, biting, or other movements of the jaw can be difficult or painful. TMJ syndrome is often mild and goes away within a few weeks. However, sometimes the condition becomes a long-term (chronic) problem. What are the causes? This condition may be caused by:  Grinding your teeth or clenching your jaw. Some people do this when they are under stress.  Arthritis.  Injury to the jaw.  Head or neck injury.  Teeth or dentures that are not aligned well. In some cases, the cause of TMJ syndrome may not be known. What are the signs or symptoms? The most common symptom of this condition is an aching pain on the side of the head in the area of the TMJ. Other symptoms may include:  Pain when moving your jaw, such as when chewing or biting.  Being unable to open your jaw all the way.  Making a clicking sound when you open your mouth.  Headache.  Earache.  Neck or shoulder pain. How is this diagnosed? This condition may be diagnosed  based on:  Your symptoms and medical history.  A physical exam. Your health care provider may check the range of motion of your jaw.  Imaging tests, such as X-rays or an MRI. You may also need to see your dentist, who will determine if your teeth and jaw are lined up correctly. How is this treated? TMJ syndrome often goes away on its own. If treatment is needed, the options may include:  Eating soft foods and applying ice or heat.  Medicines to relieve pain or inflammation.  Medicines or massage to relax the muscles.  A splint, bite plate, or mouthpiece to prevent teeth grinding or jaw clenching.  Relaxation techniques or counseling to help reduce stress.  A therapy for pain in which an electrical current is applied to the nerves through the skin (transcutaneous electrical nerve stimulation).  Acupuncture. This is sometimes helpful to relieve pain.  Jaw surgery. This is rarely needed. Follow these instructions at home:  Eating and drinking  Eat a soft diet if you are having trouble chewing.  Avoid foods that require a lot of chewing. Do not chew gum. General instructions  Take over-the-counter and prescription medicines only as told by your health care provider.  If directed, put ice on the painful area. ? Put ice in a plastic bag. ? Place a towel between your skin and the bag. ? Leave the  ice on for 20 minutes, 2-3 times a day.  Apply a warm, wet cloth (warm compress) to the painful area as directed.  Massage your jaw area and do any jaw stretching exercises as told by your health care provider.  If you were given a splint, bite plate, or mouthpiece, wear it as told by your health care provider.  Keep all follow-up visits as told by your health care provider. This is important. Contact a health care provider if:  You are having trouble eating.  You have new or worsening symptoms. Get help right away if:  Your jaw locks open or  closed. Summary  Temporomandibular joint syndrome (TMJ syndrome) is a condition that causes pain in the temporomandibular joints. These joints are located near your ears and allow your jaw to open and close.  TMJ syndrome is often mild and goes away within a few weeks. However, sometimes the condition becomes a long-term (chronic) problem.  Symptoms include an aching pain on the side of the head in the area of the TMJ, pain when chewing or biting, and being unable to open your jaw all the way. You may also make a clicking sound when you open your mouth.  TMJ syndrome often goes away on its own. If treatment is needed, it may include medicines to relieve pain, reduce inflammation, or relax the muscles. A splint, bite plate, or mouthpiece may also be used to prevent teeth grinding or jaw clenching. This information is not intended to replace advice given to you by your health care provider. Make sure you discuss any questions you have with your health care provider. Document Revised: 07/08/2017 Document Reviewed: 06/07/2017 Elsevier Patient Education  [place].   Jaw Range of Motion Exercises Jaw range of motion exercises are exercises that help your jaw move better. Exercises that help you have good posture (postural exercises) also help relieve jaw discomfort. These are often done along with range of motion exercises. These exercises can help prevent or improve:  Difficulty opening your mouth.  Pain in your jaw while it is open or closed.  Temporomandibular joint (TMJ) pain.  Headache caused by jaw tension. Take other actions to prevent or relieve jaw pain, such as:  Avoiding things that cause or increase jaw pain. This may include: ? Chewing gum or eating hard foods. ? Clenching your jaw or teeth, grinding your teeth, or keeping tension in your jaw muscles. ? Opening your mouth wide, such as for a big yawn. ? Leaning on your jaw, such as resting your jaw in your hand while  leaning on a desk.  Putting ice on your jaw. ? Put ice in a plastic bag. ? Place a towel between your skin and the bag. ? Leave the ice on for 10-15 minutes, 2-3 times a day. Only do jaw exercises that your health care provider approves of. Only move your jaw as far as it can comfortably go in each direction. Do not move your jaw into positions that cause pain. Range of motion exercises Repeat each of these exercises 8 times, 1-2 times a day, or as told by your health care provider. Exercise A: Forward protrusion 1. Push your jaw forward. Hold this position for 1-2 seconds. 2. Allow your jaw to return to its normal position and rest it there for 1-2 seconds. Exercise B: Controlled opening 1. Stand or sit in front of a mirror. Place your tongue on the roof of your mouth, just behind your top teeth. 2. Keeping  your tongue on the roof of your mouth, slowly open and close your mouth. 3. While you open and close your mouth, watch your jaw in the mirror. Try to keep your jaw from moving to one side or the other. Exercise C: Right and left motion 1. Move your jaw right. Hold this position for 1-2 seconds. Allow your jaw to return to its normal position, and rest it there for 1-2 seconds. 2. Move your jaw left. Hold this position for 1-2 seconds. Allow your jaw to return to its normal position, and rest it there for 1-2 seconds. Postural exercises Exercise A: Chin tucks 1. You can do this exercise sitting, standing, or lying down. 2. Move your head straight back, keeping your head level. You can guide the movement by placing your fingers on your chin to push your jaw back in an even motion. You should be able to feel a double chin form at the end of the motion. 3. Hold this position for 5 seconds. Repeat 10-15 times. Exercise B: Shoulder blade squeeze 1. Sit or stand. 2. Bend your elbows to about 90 degrees, which is the shape of a capital letter "L." Keep your upper arms by your body. 3. Squeeze  your shoulder blades down and back, as though you were trying to touch your elbows behind you. Do not shrug your shoulders or move your head. 4. Hold this position for 5 seconds. Repeat 10-15 times. Exercise C: Chest stretch 1. Stand facing a corner. 2. Put both of your hands and your forearms on the wall, with your arms wide apart. 3. Make sure your arms are at a 90-degree angle to your body. This means that you should hold your arms straight out from your body, level with the floor. 4. Step in toward the corner. Do not lean in. 5. Hold this position for 30 seconds. Repeat 3 times. Contact a health care provider if you have:  Jaw pain that is new or gets worse.  Clicking or popping sounds while doing the exercises. Get help right away if:  Your jaw is stuck in one place and you cannot move it.  You cannot open or close your mouth. This information is not intended to replace advice given to you by your health care provider. Make sure you discuss any questions you have with your health care provider. Document Revised: 08/18/2018 Document Reviewed: 03/23/2017 Elsevier Patient Education  [place].

## 2020-01-02 ENCOUNTER — Telehealth: Payer: Self-pay | Admitting: Family Medicine

## 2020-01-02 NOTE — Telephone Encounter (Signed)
Ok for refill? 

## 2020-01-02 NOTE — Telephone Encounter (Signed)
May refill but if fails to improve-consider ENT visit down in [place].  Please tell him there is potential cardiac risk, kidney risk, stomach bleeding risk-make sure to take with food and if any blood in the stool, chest pain to let us know.

## 2020-01-02 NOTE — Telephone Encounter (Signed)
Pt was seen by Dr. Jerline Pain on 8/5 for jaw pain. Pt states he was prescribed diclofenac sodium 75 mg. Pt asked if this could be refilled due to pain not improving.   Pt is currently in [place]. If refill is approved please send to pharmacy below:   [place] [address]  Please return patient call

## 2020-01-03 MED ORDER — DICLOFENAC SODIUM 75 MG PO TBEC: 75.0000 mg | DELAYED_RELEASE_TABLET | Freq: Two times a day (BID) | ORAL | 0 refills | Status: DC

## 2020-01-03 NOTE — Telephone Encounter (Signed)
Called and spoke with pt making him aware that refill sent in, pt states he will not be in [place] long enough for an ENT visit. He will update [place] if no improvement and will seek ENT in [place] if needed.

## 2020-03-30 NOTE — Progress Notes (Signed)
Phone: [phone]   Subjective:  Patient presents today for their annual physical. Chief complaint-noted.   See problem oriented charting- ROS- full  review of systems was completed and negative  except for: mainly left ear itching  The following were reviewed and entered/updated in epic: Past Medical History:  Diagnosis Date  . Allergy   . Carpal tunnel syndrome    left  . Injury of flexor tendon of left hand    left long finger. cleaning a fish- injury- cant bend DIP left 3rd finger  . Prostatitis    he feels like standing desk helps   Patient Active Problem List   Diagnosis Date Noted  . Insomnia 03/24/2018    Priority: Low  . Carpal tunnel syndrome     Priority: Low  . History of adenomatous polyp of colon 04/15/2008    Priority: Low   Past Surgical History:  Procedure Laterality Date  . CARPAL TUNNEL RELEASE Left   . COLONOSCOPY    . FLEXOR TENDON REPAIR Left 04/17/2015   Procedure: LEFT LONG FINGER FLEXOR TENDON REPAIR POSSIBLE GRAFT;  Surgeon: Leanora Cover, MD;  Location: [place];  Service: Orthopedics;  Laterality: Left;  . POLYPECTOMY      Family History  Problem Relation Age of Onset  . Heart disease Father   . Pneumonia Father        died summer 2018. died at 53.   . Other Mother        wheelchair bound- overweight, never exercised- age 39  . Obesity Sister   . Hypertension Sister   . ALS Brother   . Obesity Sister   . Colon cancer Neg Hx   . Stomach cancer Neg Hx   . Rectal cancer Neg Hx   . Colon polyps Neg Hx   . Esophageal cancer Neg Hx     Medications- reviewed and updated No current outpatient medications on file.   No current facility-administered medications for this visit.    Allergies-reviewed and updated No Known Allergies  Social History   Social History Narrative   Married. 5 kids ranging form 21 to 31 in 2021. 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       Hobbies: travel, fishing, walking, hiking, movies    Objective  Objective:  BP 112/64   Pulse 73   Temp 98 F (36.7 C) (Temporal)   Resp 18   Ht 5\' 10"  (1.778 m)   Wt 172 lb 12.8 oz (78.4 kg)   SpO2 97%   BMI 24.79 kg/m  Gen: NAD, resting comfortably HEENT: Mucous membranes are moist. Oropharynx normal Neck: no thyromegaly CV: RRR no murmurs rubs or gallops Lungs: CTAB no crackles, wheeze, rhonchi Abdomen: soft/nontender/nondistended/normal bowel sounds. No rebound or guarding.  Ext: no edema Skin: warm, dry Neuro: grossly normal, moves all extremities, PERRLA   Assessment and Plan  64 y.o. male presenting for annual physical.  Health Maintenance counseling: 1. Anticipatory guidance: Patient counseled regarding regular dental exams q6 months, eye exams - every other yearly,  avoiding smoking and second hand smoke, limiting alcohol to 2 beverages per day- 2 a week maybe.   2. Risk factor reduction:  Advised patient of need for regular exercise and diet rich and fruits and vegetables to reduce risk of heart attack and stroke. Exercise- walking 2 miles with wife 4x a week- not back at [facility]. Diet-reasonably healthy diet.  172 last physical- would prefer to be 5 lbs less.  Wt Readings from Last 3 Encounters:  03/31/20 172 lb 12.8 oz (78.4 kg)  12/13/19 168 lb 3.2 oz (76.3 kg)  03/27/19 172 lb 12.8 oz (78.4 kg)  3. Immunizations/screenings/ancillary studies-has received first COVID-19 vaccination and pending the second.  Already had flu shot same day as booster and did not have many issues Immunization History  Administered Date(s) Administered  . Influenza Inj Mdck Quad With Preservative 03/01/2019  . Influenza Whole 03/05/2009, 02/23/2010  . Influenza,inj,Quad PF,6+ Mos 04/09/2013, 04/09/2014, 04/07/2015, 03/14/2017, 03/24/2018, 03/12/2020  . PFIZER SARS-COV-2 Vaccination 07/23/2019, 08/14/2019, 03/12/2020  . Td 05/11/2007  . Tdap 03/24/2018  . Zoster Recombinat  (Shingrix) 03/27/2019, 07/10/2019  4. Prostate cancer screening- low risk prior PSA trend.  Update with labs today.  Defer rectal due to low risk trend.  Nocturia stable 3 times a night with good urinary flow-stable from last year Lab Results  Component Value Date   PSA 0.68 03/27/2019   PSA 0.57 03/24/2018   PSA 0.58 03/14/2017   5. Colon cancer screening - history of adenomatous polyp of the colon and will be due for repeat colonoscopy in 2023-last colonoscopy March 15, 2017 6. Skin cancer screening-saw dermatology within last year Dr. Michele Mcalpine (father of his daughter in law). never advised regular sunscreen use. Denies worrisome, changing, or new skin lesions.  7.  Never smoker 8. STD screening - declined is only active with wife  Status of chronic or acute concerns   #hyperlipidemia S: Medication:None.  He prefers to work on lifestyle instead of considering statin unless risk is higher.  Last year 10-year ASCVD risk 8.4% Lab Results  Component Value Date   CHOL 196 03/27/2019   HDL 57.50 03/27/2019   LDLCALC 118 (H) 03/27/2019   LDLDIRECT 126.5 02/17/2011   TRIG 105.0 03/27/2019   CHOLHDL 3 03/27/2019   A/P: for now as long as risk remains below 10-12% focus on lifestyle- possible consider coronary calcium scoring at later date if lifestyle does not curb risk    #Insomnia S: in the past Used Ambien 10 mg as needed for sleep- has come off htis.  For the most part does okay with Tylenol PM now once every 2 weeks. nocturia likely contributes A/P: doing better overall and wants to remain off ambien   #TMJD/arthalgia- voltaren was helpful after 2 courses   Recommended follow up: 1 year physical planned  Lab/Order associations: fasting   ICD-10-CM   1. Preventative health care  N39.76 COMPLETE METABOLIC PANEL WITH GFR    CBC with Differential/Platelet    PSA    Lipid Panel w/reflex Direct LDL  2. Hyperlipidemia, unspecified hyperlipidemia type  B34.1 COMPLETE METABOLIC PANEL  WITH GFR    CBC with Differential/Platelet    Lipid Panel w/reflex Direct LDL  3. Screening for prostate cancer  Z12.5 PSA    No orders of the defined types were placed in this encounter.   Return precautions advised.  Garret Reddish, MD

## 2020-03-30 NOTE — Patient Instructions (Addendum)
Please stop by lab before you go If you have mychart- we will send your results within 3 business days of [place] receiving them.  If you do not have mychart- we will call you about results within 5 business days of [place] receiving them.  *please note we are currently using [facility] which has a longer processing time than [place] typically so labs may not come back as quickly as in the past *please also note that you will see labs on mychart as soon as they post. I will later go in and write notes on them- will say "notes from Dr. Yong Channel"  No changes today. Glad you are doing so well.

## 2020-03-31 ENCOUNTER — Encounter: Payer: Self-pay | Admitting: Family Medicine

## 2020-03-31 ENCOUNTER — Ambulatory Visit (INDEPENDENT_AMBULATORY_CARE_PROVIDER_SITE_OTHER): Payer: BC Managed Care – PPO | Admitting: Family Medicine

## 2020-03-31 ENCOUNTER — Other Ambulatory Visit: Payer: Self-pay

## 2020-03-31 VITALS — BP 112/64 | HR 73 | Temp 98.0°F | Resp 18 | Ht 70.0 in | Wt 172.8 lb

## 2020-03-31 DIAGNOSIS — E785 Hyperlipidemia, unspecified: Secondary | ICD-10-CM | POA: Diagnosis not present

## 2020-03-31 DIAGNOSIS — Z125 Encounter for screening for malignant neoplasm of prostate: Secondary | ICD-10-CM

## 2020-03-31 DIAGNOSIS — Z0000 Encounter for general adult medical examination without abnormal findings: Secondary | ICD-10-CM

## 2020-03-31 LAB — COMPLETE METABOLIC PANEL WITH GFR
AG Ratio: 1.8 (calc) (ref 1.0–2.5)
ALT: 16 U/L (ref 9–46)
AST: 20 U/L (ref 10–35)
Albumin: 4.4 g/dL (ref 3.6–5.1)
Alkaline phosphatase (APISO): 64 U/L (ref 35–144)
BUN: 13 mg/dL (ref 7–25)
CO2: 30 mmol/L (ref 20–32)
Calcium: 9.7 mg/dL (ref 8.6–10.3)
Chloride: 103 mmol/L (ref 98–110)
Creat: 1.02 mg/dL (ref 0.70–1.25)
GFR, Est African American: 90 mL/min/{1.73_m2} (ref >=60)
GFR, Est Non African American: 77 mL/min/{1.73_m2} (ref >=60)
Globulin: 2.5 g/dL (calc) (ref 1.9–3.7)
Glucose, Bld: 103 mg/dL — ABNORMAL HIGH (ref 65–99)
Potassium: 5.2 mmol/L (ref 3.5–5.3)
Sodium: 139 mmol/L (ref 135–146)
Total Bilirubin: 0.8 mg/dL (ref 0.2–1.2)
Total Protein: 6.9 g/dL (ref 6.1–8.1)

## 2020-03-31 LAB — CBC WITH DIFFERENTIAL/PLATELET
Absolute Monocytes: 630 cells/uL (ref 200–950)
Basophils Absolute: 18 cells/uL (ref 0–200)
Basophils Relative: 0.3 %
Eosinophils Absolute: 42 cells/uL (ref 15–500)
Eosinophils Relative: 0.7 %
HCT: 48.2 % (ref 38.5–50.0)
Hemoglobin: 16 g/dL (ref 13.2–17.1)
Lymphs Abs: 1362 cells/uL (ref 850–3900)
MCH: 31.2 pg (ref 27.0–33.0)
MCHC: 33.2 g/dL (ref 32.0–36.0)
MCV: 94 fL (ref 80.0–100.0)
MPV: 11.1 fL (ref 7.5–12.5)
Monocytes Relative: 10.5 %
Neutro Abs: 3948 cells/uL (ref 1500–7800)
Neutrophils Relative %: 65.8 %
Platelets: 249 10*3/uL (ref 140–400)
RBC: 5.13 10*6/uL (ref 4.20–5.80)
RDW: 12 % (ref 11.0–15.0)
Total Lymphocyte: 22.7 %
WBC: 6 10*3/uL (ref 3.8–10.8)

## 2020-03-31 LAB — LIPID PANEL W/REFLEX DIRECT LDL
Cholesterol: 222 mg/dL — ABNORMAL HIGH (ref <200)
HDL: 73 mg/dL (ref >=40)
LDL Cholesterol (Calc): 130 mg/dL (calc) — ABNORMAL HIGH
Non-HDL Cholesterol (Calc): 149 mg/dL (calc) — ABNORMAL HIGH (ref <130)
Total CHOL/HDL Ratio: 3 (calc) (ref <5.0)
Triglycerides: 91 mg/dL (ref <150)

## 2020-03-31 LAB — PSA: PSA: 0.73 ng/mL (ref <=4.0)

## 2020-12-19 ENCOUNTER — Encounter: Payer: Self-pay | Admitting: Family Medicine

## 2020-12-19 ENCOUNTER — Other Ambulatory Visit: Payer: Self-pay

## 2020-12-19 ENCOUNTER — Telehealth (INDEPENDENT_AMBULATORY_CARE_PROVIDER_SITE_OTHER): Payer: BC Managed Care – PPO | Admitting: Family Medicine

## 2020-12-19 DIAGNOSIS — U071 COVID-19: Secondary | ICD-10-CM

## 2020-12-19 MED ORDER — MOLNUPIRAVIR EUA 200MG CAPSULE: 4.0000 | ORAL_CAPSULE | Freq: Two times a day (BID) | ORAL | 0 refills | Status: AC

## 2020-12-19 MED ORDER — BENZONATATE 100 MG PO CAPS: 100.0000 mg | ORAL_CAPSULE | Freq: Three times a day (TID) | ORAL | 0 refills | Status: DC | PRN

## 2020-12-19 NOTE — Progress Notes (Signed)
Chief Complaint  Patient presents with   Covid Positive    Tested positive 12/18/20   Fatigue   Chills   Sore Throat    Congestion  Not eating    Ronald Gardner here for URI complaints. Due to COVID-19 pandemic, we are interacting via web portal for an electronic face-to-face visit. I verified patient's ID using 2 identifiers. Patient agreed to proceed with visit via this method. Patient is at home, I am at office. Patient, wife and I are present for visit.   Duration: 2 days  Associated symptoms: sinus headache, sinus congestion, rhinorrhea, sore throat, and coughing, fatigue, aches, chills Denies: sinus pain, itchy watery eyes, ear pain, ear drainage, sore throat, wheezing, shortness of breath, and fevers, loss of taste smell/taste, N/V/D Treatment to date: ibuprofen, Dayquil, Nyquil Sick contacts: Yes Tested + for covid on 12/18/20  Past Medical History:  Diagnosis Date   Allergy    Carpal tunnel syndrome    left   Injury of flexor tendon of left hand    left long finger. cleaning a fish- injury- cant bend DIP left 3rd finger   Prostatitis    he feels like standing desk helps    Objective No conversational dyspnea Age appropriate judgment and insight Nml affect and mood  COVID-19 - Plan: molnupiravir EUA 200 mg CAPS, benzonatate (TESSALON) 100 MG capsule  Antiviral given age. Benzonatate prn. Quarantining guidelines discussed. Continue to push fluids, practice good hand hygiene, cover mouth when coughing. F/u prn. If starting to experience irreplaceable fluid loss, shaking, or shortness of breath, seek immediate care. Pt voiced understanding and agreement to the plan.  [place], DO 12/19/20 3:02 PM

## 2021-03-05 ENCOUNTER — Other Ambulatory Visit: Payer: Self-pay

## 2021-03-05 ENCOUNTER — Ambulatory Visit (INDEPENDENT_AMBULATORY_CARE_PROVIDER_SITE_OTHER): Payer: BC Managed Care – PPO | Admitting: Family Medicine

## 2021-03-05 ENCOUNTER — Encounter: Payer: Self-pay | Admitting: Family Medicine

## 2021-03-05 VITALS — BP 114/78 | HR 65 | Temp 97.9°F | Ht 70.0 in | Wt 167.6 lb

## 2021-03-05 DIAGNOSIS — Z125 Encounter for screening for malignant neoplasm of prostate: Secondary | ICD-10-CM

## 2021-03-05 DIAGNOSIS — G4700 Insomnia, unspecified: Secondary | ICD-10-CM | POA: Diagnosis not present

## 2021-03-05 DIAGNOSIS — Z23 Encounter for immunization: Secondary | ICD-10-CM | POA: Diagnosis not present

## 2021-03-05 DIAGNOSIS — Z0000 Encounter for general adult medical examination without abnormal findings: Secondary | ICD-10-CM | POA: Diagnosis not present

## 2021-03-05 DIAGNOSIS — E785 Hyperlipidemia, unspecified: Secondary | ICD-10-CM | POA: Diagnosis not present

## 2021-03-05 LAB — COMPREHENSIVE METABOLIC PANEL
ALT: 14 U/L (ref 0–53)
AST: 19 U/L (ref 0–37)
Albumin: 4.2 g/dL (ref 3.5–5.2)
Alkaline Phosphatase: 55 U/L (ref 39–117)
BUN: 13 mg/dL (ref 6–23)
CO2: 30 mEq/L (ref 19–32)
Calcium: 9.2 mg/dL (ref 8.4–10.5)
Chloride: 101 mEq/L (ref 96–112)
Creatinine, Ser: 1.01 mg/dL (ref 0.40–1.50)
GFR: 78 mL/min (ref >60.00)
Glucose, Bld: 92 mg/dL (ref 70–99)
Potassium: 4.3 mEq/L (ref 3.5–5.1)
Sodium: 137 mEq/L (ref 135–145)
Total Bilirubin: 0.9 mg/dL (ref 0.2–1.2)
Total Protein: 6.5 g/dL (ref 6.0–8.3)

## 2021-03-05 LAB — CBC WITH DIFFERENTIAL/PLATELET
Basophils Absolute: 0 10*3/uL (ref 0.0–0.1)
Basophils Relative: 0.7 % (ref 0.0–3.0)
Eosinophils Absolute: 0 10*3/uL (ref 0.0–0.7)
Eosinophils Relative: 0.4 % (ref 0.0–5.0)
HCT: 45.1 % (ref 39.0–52.0)
Hemoglobin: 15.1 g/dL (ref 13.0–17.0)
Lymphocytes Relative: 27.1 % (ref 12.0–46.0)
Lymphs Abs: 1.3 10*3/uL (ref 0.7–4.0)
MCHC: 33.4 g/dL (ref 30.0–36.0)
MCV: 93.2 fl (ref 78.0–100.0)
Monocytes Absolute: 0.5 10*3/uL (ref 0.1–1.0)
Monocytes Relative: 9.8 % (ref 3.0–12.0)
Neutro Abs: 3 10*3/uL (ref 1.4–7.7)
Neutrophils Relative %: 62 % (ref 43.0–77.0)
Platelets: 219 10*3/uL (ref 150.0–400.0)
RBC: 4.84 Mil/uL (ref 4.22–5.81)
RDW: 13.6 % (ref 11.5–15.5)
WBC: 4.9 10*3/uL (ref 4.0–10.5)

## 2021-03-05 LAB — LIPID PANEL
Cholesterol: 183 mg/dL (ref 0–200)
HDL: 59.9 mg/dL (ref >39.00)
LDL Cholesterol: 110 mg/dL — ABNORMAL HIGH (ref 0–99)
NonHDL: 122.87
Total CHOL/HDL Ratio: 3
Triglycerides: 63 mg/dL (ref 0.0–149.0)
VLDL: 12.6 mg/dL (ref 0.0–40.0)

## 2021-03-05 LAB — PSA: PSA: 0.7 ng/mL (ref 0.10–4.00)

## 2021-03-05 NOTE — Patient Instructions (Addendum)
Health Maintenance Due  Topic Date Due   COVID-19 Vaccine (4 - Booster for [employer] series) -Team please log 4th COVID shot from 09/30/2020.  - Consider  getting Omicron/Bivalent booster only at your local pharmacy at least 3 months out since having COVID-19 in August or you can wait until next year! Please let us know when you have received this vaccination.  05/07/2020   Pneumonia Vaccine 69+ Years old (1 - PCV) - Prevnar 75 today before you leave.  Never done   INFLUENZA VACCINE  - High Dose flu shot today before you leave.  12/08/2020   Please stop by lab before you go If you have mychart- we will send your results within 3 business days of [place] receiving them.  If you do not have mychart- we will call you about results within 5 business days of [place] receiving them.  *please also note that you will see labs on mychart as soon as they post. I will later go in and write notes on them- will say "notes from Dr. Yong Channel"  Consider using diclofenac/Voltaren gel over-the-counter for your knees and ankles.  We will call you within two weeks about your referral to coronary artery calcium scoring. If you do not hear within 2 weeks, give [place] a call.   Recommended follow up: Return in about 1 year (around 03/05/2022) for a physical or sooner if needed.

## 2021-03-05 NOTE — Progress Notes (Signed)
Phone: [phone]   Subjective:  Patient presents today for their annual physical. Chief complaint-noted.   See problem oriented charting- Review of Systems  Constitutional:  Negative for chills and fever.  HENT:  Negative for congestion, nosebleeds and sore throat.   Eyes:  Negative for blurred vision and double vision.  Respiratory:  Positive for shortness of breath (perhaps mild trouble after covid with getting deep breath). Negative for cough.   Cardiovascular:  Negative for chest pain and palpitations.  Gastrointestinal:  Negative for abdominal pain, blood in stool, constipation, diarrhea, heartburn, melena, nausea and vomiting.  Genitourinary:  Positive for frequency (yes at night- 3x but stable). Negative for dysuria.  Musculoskeletal:  Positive for joint pain (knees and ankles). Negative for myalgias.  Skin:  Negative for itching and rash.  Neurological:  Negative for dizziness and headaches.  Endo/Heme/Allergies:  Negative for polydipsia. Does not bruise/bleed easily.  Psychiatric/Behavioral:  Negative for depression and suicidal ideas.    The following were reviewed and entered/updated in epic: Past Medical History:  Diagnosis Date   Allergy    Carpal tunnel syndrome    left   Injury of flexor tendon of left hand    left long finger. cleaning a fish- injury- cant bend DIP left 3rd finger   Prostatitis    he feels like standing desk helps   Patient Active Problem List   Diagnosis Date Noted   Insomnia 03/24/2018    Priority: 3.   Carpal tunnel syndrome     Priority: 3.   History of adenomatous polyp of colon 04/15/2008    Priority: 3.   Past Surgical History:  Procedure Laterality Date   CARPAL TUNNEL RELEASE Left    COLONOSCOPY     FLEXOR TENDON REPAIR Left 04/17/2015   Procedure: LEFT LONG FINGER FLEXOR TENDON REPAIR POSSIBLE GRAFT;  Surgeon: Leanora Cover, MD;  Location: [place];  Service: Orthopedics;  Laterality: Left;   POLYPECTOMY       Family History  Problem Relation Age of Onset   Heart disease Father        former smoker through age 18. lived to 95.    Pneumonia Father        died summer 2018. died at 72.    Other Mother        wheelchair bound- overweight, never exercised- age 22   Obesity Sister    Hypertension Sister    ALS Brother    Obesity Sister    Colon cancer Neg Hx    Stomach cancer Neg Hx    Rectal cancer Neg Hx    Colon polyps Neg Hx    Esophageal cancer Neg Hx     Medications- reviewed and updated No current outpatient medications on file.   No current facility-administered medications for this visit.    Allergies-reviewed and updated No Known Allergies  Social History   Social History Narrative   Married. 5 kids ranging form 21 to 31 in 2021. 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       Hobbies: travel, fishing, walking, hiking, movies    Objective  Objective:  BP 114/78   Pulse 65   Temp 97.9 F (36.6 C)   Ht $R'5\' 10"'Hr$  (1.778 m)   Wt 167 lb 9.6 oz (76 kg)   SpO2 97%   BMI 24.05 kg/m  Gen: NAD, resting comfortably HEENT: Mucous membranes are moist. Oropharynx  normal Neck: no thyromegaly CV: RRR no murmurs rubs or gallops Lungs: CTAB no crackles, wheeze, rhonchi Abdomen: soft/nontender/nondistended/normal bowel sounds. No rebound or guarding.  Ext: no edema Skin: warm, dry Neuro: grossly normal, moves all extremities, PERRLA    Assessment and Plan  65 y.o. male presenting for annual physical.  Health Maintenance counseling: 1. Anticipatory guidance: Patient counseled regarding regular dental exams -q6 months, eye exams -yearly,  avoiding smoking and second hand smoke , limiting alcohol to 2 beverages per day - perhaps once a week- glass of wine or a glass of scotch. No illicit drugs. 2. Risk factor reduction:  Advised patient of need for regular exercise and diet rich and fruits and vegetables to reduce risk  of heart attack and stroke. Exercise- walks 2 miles with wife 4x a week - back at [facility] now- doing some jogging and some weight lifting.  Diet-reasonably healthy diet.  Patient met his goal of losing 5 pounds over the last year- wants to get down to low 160s on home scales  Wt Readings from Last 3 Encounters:  03/05/21 167 lb 9.6 oz (76 kg)  03/31/20 172 lb 12.8 oz (78.4 kg)  12/13/19 168 lb 3.2 oz (76.3 kg)  3. Immunizations/screenings/ancillary studies- discussed Omicron/Bivalent booster-he will consider but needs to wait at least 3 months, Pneumonia vaccination--recommended Prevnar 20 today, and Flu shot-high dose - otherwise up-to-date. Immunization History  Administered Date(s) Administered   Influenza Inj Mdck Quad With Preservative 03/01/2019   Influenza Whole 03/05/2009, 02/23/2010   Influenza,inj,Quad PF,6+ Mos 04/09/2013, 04/09/2014, 04/07/2015, 03/14/2017, 03/24/2018, 03/12/2020   PFIZER Comirnaty(Gray Top)Covid-19 Tri-Sucrose Vaccine 09/30/2020   PFIZER(Purple Top)SARS-COV-2 Vaccination 07/23/2019, 08/14/2019, 03/12/2020   Td 05/11/2007   Tdap 03/24/2018   Zoster Recombinat (Shingrix) 03/27/2019, 07/10/2019  4. Prostate cancer screening- low risk prior PSA trend-continue to trend with labs today.  Stable nocturia 3x a night  Lab Results  Component Value Date   PSA 0.73 03/31/2020   PSA 0.68 03/27/2019   PSA 0.57 03/24/2018   5. Colon cancer screening - history of adenomatous polyp of the colon - last done 03/15/2017 with a 5 year repeat planned and due 2023 6. Skin cancer screening- Follows with Dr. Michele Mcalpine  as needed(father of his daughter-in-law). advised regular sunscreen use. Denies worrisome, changing, or new skin lesions.  7. Smoking associated screening (lung cancer screening, AAA screen 65-75, UA)- Never smoker 8. STD screening - patient declines as only active with his wife  Status of chronic or acute concerns   #Insurance required CPE prior to 03/08/21- typically  would wait until 366 days from last CPE  #Post COVID-19 Update- Patient had COVID-19 back in 12/19/2020 and was seen virtually by Dr. Nani Ravens. Patient was started on treatment with molnupiravir and Tessalon 100 mg 3x daily. After covid cannot take as deep of a breath- feels like affects jogging slightly- also wonders if jogging as had taken 3 year break  #hyperlipidemia S: Medication:None at present - he preferred to work on lifestyle instead of starting on statins unless risk is higher - 10-year ASCVD risk in 2020 was 8.4 %, calculated using last year's labs and current age at 9.1% today - possibly consider coronary calcium scoring at later date if lifestyle does not curb risk Lab Results  Component Value Date   CHOL 222 (H) 03/31/2020   HDL 73 03/31/2020   LDLCALC 130 (H) 03/31/2020   LDLDIRECT 126.5 02/17/2011   TRIG 91 03/31/2020   CHOLHDL 3.0 03/31/2020   A/P: mild  cholesterol elevations- will get coronary artery calcium score for more info  # Insomnia S: used Ambien 10 mg in the past as needed for sleep - no longer on this. More of maintenance issue though - Most part does okay with Tylenol PM once every 1 week  - Nocturia most likely contributes - goes to bathroom 3 times per night. A/P: insomnia but doe snot want to go back to prescription med- continue to monitor   #TMJ D-Voltaren helpful in the past- no recent flares   Recommended follow up: No follow-ups on file.  Lab/Order associations: fasting. Opts out of urine    ICD-10-CM   1. Preventative health care  Z00.00 CBC with Differential/Platelet    Comprehensive metabolic panel    Lipid panel    PSA    2. Insomnia, unspecified type  G47.00     3. Hyperlipidemia, unspecified hyperlipidemia type  E78.5 CBC with Differential/Platelet    Comprehensive metabolic panel    Lipid panel    4. Screening for prostate cancer  Z12.5 PSA      No orders of the defined types were placed in this encounter.  I,Harris  Phan,acting as a Education administrator for Garret Reddish, MD.,have documented all relevant documentation on the behalf of Garret Reddish, MD,as directed by  Garret Reddish, MD while in the presence of Garret Reddish, MD.   I, Garret Reddish, MD, have reviewed all documentation for this visit. The documentation on 03/05/21 for the exam, diagnosis, procedures, and orders are all accurate and complete.   Return precautions advised.  Garret Reddish, MD

## 2021-03-05 NOTE — Addendum Note (Signed)
Addended by: Baron Hamper on: 03/05/2021 02:26 PM   Modules accepted: Orders

## 2021-03-24 ENCOUNTER — Other Ambulatory Visit: Payer: BC Managed Care – PPO

## 2021-04-06 ENCOUNTER — Other Ambulatory Visit: Payer: Self-pay

## 2021-04-06 ENCOUNTER — Ambulatory Visit (INDEPENDENT_AMBULATORY_CARE_PROVIDER_SITE_OTHER)
Admission: RE | Admit: 2021-04-06 | Discharge: 2021-04-06 | Disposition: A | Discharge status: Home / Self Care | Payer: Self-pay | Source: Ambulatory Visit | Attending: Family Medicine | Admitting: Family Medicine

## 2021-04-06 DIAGNOSIS — E785 Hyperlipidemia, unspecified: Secondary | ICD-10-CM

## 2021-04-07 ENCOUNTER — Encounter: Payer: Self-pay | Admitting: Family Medicine

## 2021-04-07 ENCOUNTER — Other Ambulatory Visit: Payer: Self-pay

## 2021-04-07 MED ORDER — ROSUVASTATIN CALCIUM 10 MG PO TABS: 10.0000 mg | ORAL_TABLET | Freq: Every day | ORAL | 3 refills | Status: DC

## 2021-11-06 ENCOUNTER — Encounter: Payer: Self-pay | Admitting: Family Medicine

## 2021-11-06 ENCOUNTER — Ambulatory Visit (INDEPENDENT_AMBULATORY_CARE_PROVIDER_SITE_OTHER): Payer: BC Managed Care – PPO | Admitting: Family Medicine

## 2021-11-06 VITALS — BP 112/72 | HR 86 | Temp 98.6°F | Ht 70.0 in | Wt 171.6 lb

## 2021-11-06 DIAGNOSIS — E785 Hyperlipidemia, unspecified: Secondary | ICD-10-CM | POA: Diagnosis not present

## 2021-11-06 DIAGNOSIS — K146 Glossodynia: Secondary | ICD-10-CM | POA: Diagnosis not present

## 2021-11-06 DIAGNOSIS — I2510 Atherosclerotic heart disease of native coronary artery without angina pectoris: Secondary | ICD-10-CM | POA: Diagnosis not present

## 2021-11-06 LAB — COMPREHENSIVE METABOLIC PANEL
ALT: 15 U/L (ref 0–53)
AST: 16 U/L (ref 0–37)
Albumin: 4.4 g/dL (ref 3.5–5.2)
Alkaline Phosphatase: 65 U/L (ref 39–117)
BUN: 14 mg/dL (ref 6–23)
CO2: 32 mEq/L (ref 19–32)
Calcium: 9.4 mg/dL (ref 8.4–10.5)
Chloride: 102 mEq/L (ref 96–112)
Creatinine, Ser: 0.98 mg/dL (ref 0.40–1.50)
GFR: 80.49 mL/min (ref >60.00)
Glucose, Bld: 63 mg/dL — ABNORMAL LOW (ref 70–99)
Potassium: 3.9 mEq/L (ref 3.5–5.1)
Sodium: 140 mEq/L (ref 135–145)
Total Bilirubin: 0.7 mg/dL (ref 0.2–1.2)
Total Protein: 6.7 g/dL (ref 6.0–8.3)

## 2021-11-06 LAB — LDL CHOLESTEROL, DIRECT: Direct LDL: 59 mg/dL

## 2021-11-06 LAB — IBC + FERRITIN
Ferritin: 115 ng/mL (ref 22.0–322.0)
Iron: 104 ug/dL (ref 42–165)
Saturation Ratios: 25.8 % (ref 20.0–50.0)
TIBC: 403.2 ug/dL (ref 250.0–450.0)
Transferrin: 288 mg/dL (ref 212.0–360.0)

## 2021-11-06 LAB — VITAMIN B12: Vitamin B-12: 259 pg/mL (ref 211–911)

## 2021-11-06 NOTE — Progress Notes (Signed)
Phone [phone] In person visit   Subjective:   Ronald Gardner is a 66 y.o. year old very pleasant male patient who presents for/with See problem oriented charting Chief Complaint  Patient presents with   Follow-up    Pt is here to f/u on statin he states he has had "burning mouth syndrome".   Past Medical History-  Patient Active Problem List   Diagnosis Date Noted   Hyperlipidemia 11/06/2021    Priority: Medium    CAD (coronary artery disease) 11/06/2021    Priority: Medium    Insomnia 03/24/2018    Priority: Low   Carpal tunnel syndrome     Priority: Low   History of adenomatous polyp of colon 04/15/2008    Priority: Low    Medications- reviewed and updated Current Outpatient Medications  Medication Sig Dispense Refill   rosuvastatin (CRESTOR) 10 MG tablet Take 1 tablet (10 mg total) by mouth daily. 90 tablet 3   No current facility-administered medications for this visit.     Objective:  BP 112/72   Pulse 86   Temp 98.6 F (37 C)   Ht '5\' 10"'$  (1.778 m)   Wt 171 lb 9.6 oz (77.8 kg)   SpO2 95%   BMI 24.62 kg/m  Gen: NAD, resting comfortably Oral cavity normal CV: RRR no murmurs rubs or gallops Lungs: CTAB no crackles, wheeze, rhonchi Ext: no edema Skin: warm, dry    Assessment and Plan   #hyperlipidemia #Nonobstructive CAD-CT cardiac scoring a 65th percentile S: Medication:rosuvastatin  10 mg -enjoying sage well lifting 3 days a week and getting 2 more walking- up a few lbs which likely is muscle mass growth -No chest pain or shortness of breath reported Lab Results  Component Value Date   CHOL 183 03/05/2021   HDL 59.90 03/05/2021   LDLCALC 110 (H) 03/05/2021   LDLDIRECT 126.5 02/17/2011   TRIG 63.0 03/05/2021   CHOLHDL 3 03/05/2021   A/P: Prior mild poor control-hopefully lipids have improved on rosuvastatin-goal LDL under 70.  Fortunately does not peer to be experiencing side effects.  Doing a great job with healthy lifestyle changes-for  now continue current medication -Nonobstructive CAD asymptomatic-continue to follow  # Burning mouth syndrome S:started after dental visit 2 months (well after starting statin)  ago. Has had some mild improvement- was hard to eat or drink at all at first. [name] bothered him. Cold water feels good though.  -some stressors including upcoming wedding and losing mom in march A/P: We discussed multiple potential etiologies-we will at least update to look for obvious vitamin deficiency as below.  Discussed some potential treatments but he does not want abnormal medication-could try diluted [place] per clinical key recommendation up to 4 times a day to down regulate nerve fibers/desensitize  Recommended follow up: Return for next already scheduled visit or sooner if needed. Future Appointments  Date Time Provider [place]  03/30/2022 11:00 AM Marin Olp, MD [facility]    Lab/Order associations:   ICD-10-CM   1. Hyperlipidemia, unspecified hyperlipidemia type  E78.5 Comprehensive metabolic panel    LDL cholesterol, direct    2. Coronary artery disease involving native coronary artery of native heart without angina pectoris  I25.10     3. Burning mouth syndrome  K14.6 Vitamin B12    Vitamin B6    IBC + Ferritin    Zinc      No orders of the defined types were placed in this encounter.   Return precautions  advised.  Garret Reddish, MD

## 2021-11-06 NOTE — Patient Instructions (Addendum)
Please stop by lab before you go If you have mychart- we will send your results within 3 business days of [place] receiving them.  If you do not have mychart- we will call you about results within 5 business days of [place] receiving them.  *please also note that you will see labs on mychart as soon as they post. I will later go in and write notes on them- will say "notes from Dr. Yong Channel"   Recommended follow up: Return for next already scheduled visit or sooner if needed. Physical in november

## 2021-11-11 LAB — VITAMIN B6: Vitamin B6: 9.5 ng/mL (ref 2.1–21.7)

## 2021-11-11 LAB — ZINC: Zinc: 62 ug/dL (ref 60–130)

## 2021-11-18 ENCOUNTER — Ambulatory Visit: Payer: BC Managed Care – PPO | Admitting: Family Medicine

## 2021-12-21 IMAGING — CT CT CARDIAC CORONARY ARTERY CALCIUM SCORE
3 series · 14 of 20 positions shown, 16 images · non-contrast
Comparison: None.
COMPARISON: None.

Addendum:
EXAM:
OVER-READ INTERPRETATION  CT CHEST

The following report is an over-read performed by radiologist Dr.
Varghese Milano [REDACTED] on 04/06/2021. This
over-read does not include interpretation of cardiac or coronary
anatomy or pathology. The coronary calcium score interpretation by
the cardiologist is attached.
CLINICAL DATA: Cardiovascular disease risk stratification
ECG abnormal, 10yr CHD risk < 10%, treadmill candidate
CT Coronary Calcium Score
TECHNIQUE: A gated, non-contrast computed tomography scan of the heart was
performed using 3mm slice thickness. Axial images were analyzed on a
dedicated workstation. Calcium scoring of the coronary arteries was
performed using the Agatston method.

[Series 2: cascseq 2.0 sa36 70% (id) · axial · 0.39mm/px · z∈[-277,-187]mm · 4 of 76 slices shown]
[im 16/76  vessel]
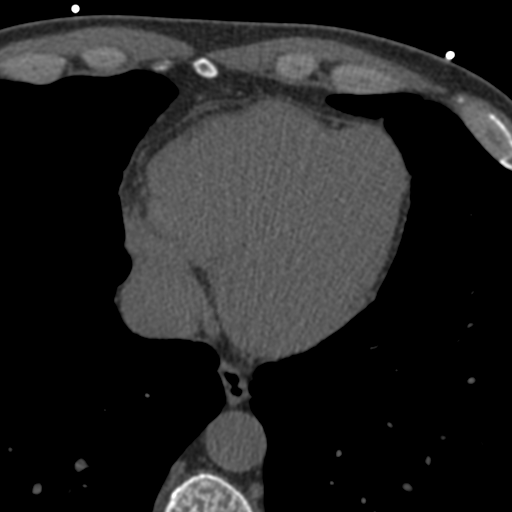
[im 31/76  vessel]
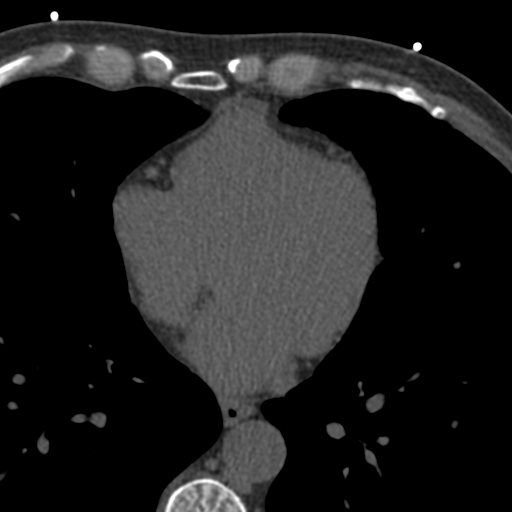
[im 46/76  vessel]
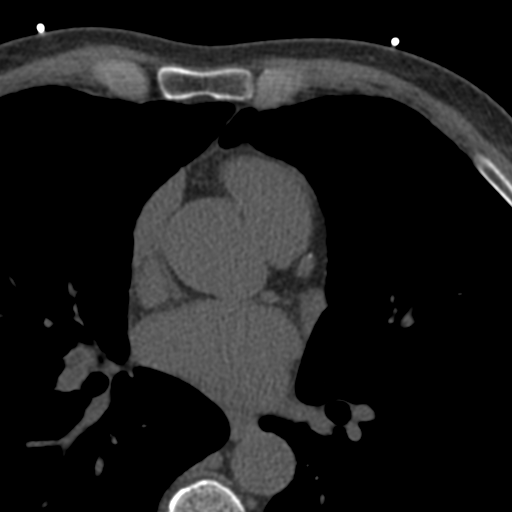
[im 61/76  vessel]
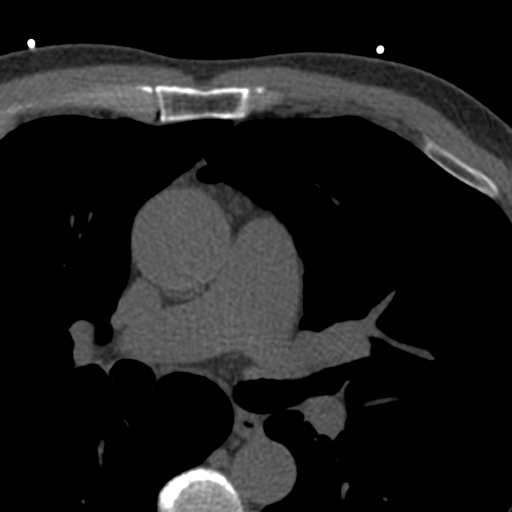

[Series 3: cascseq 2.0 bf37 st · axial · 0.73mm/px · z∈[-283,-183]mm · 5 of 76 slices shown, 7 images]
[im 13/76  vessel]
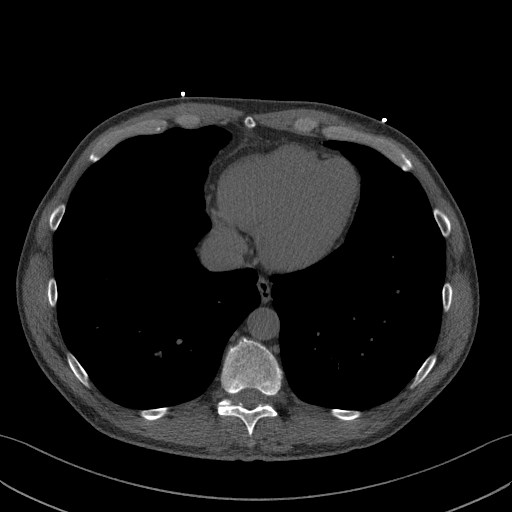
[im 13/76  lung]
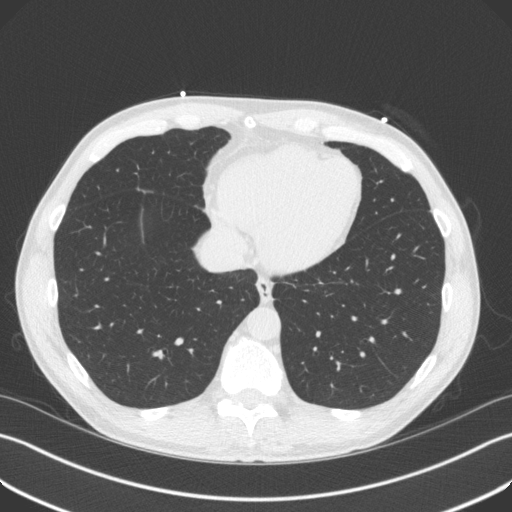
[im 26/76  vessel]
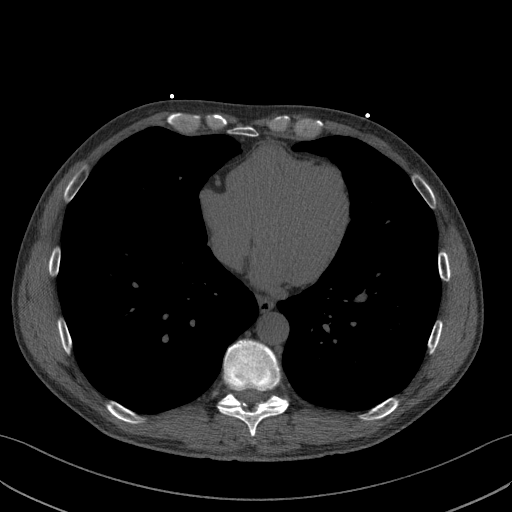
[im 38/76  vessel]
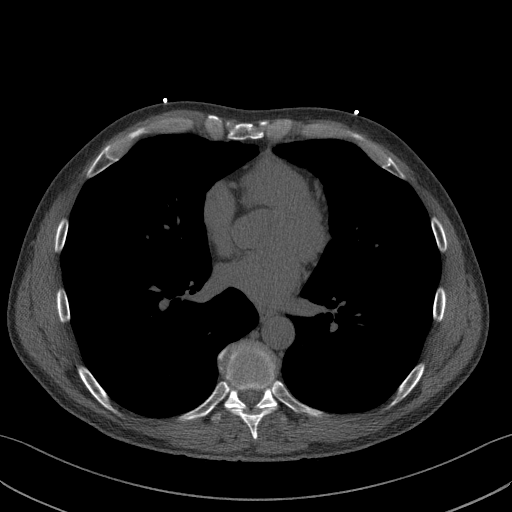
[im 51/76  vessel]
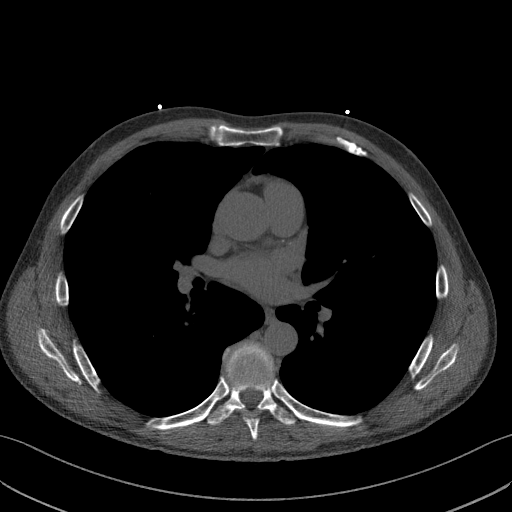
[im 63/76  vessel]
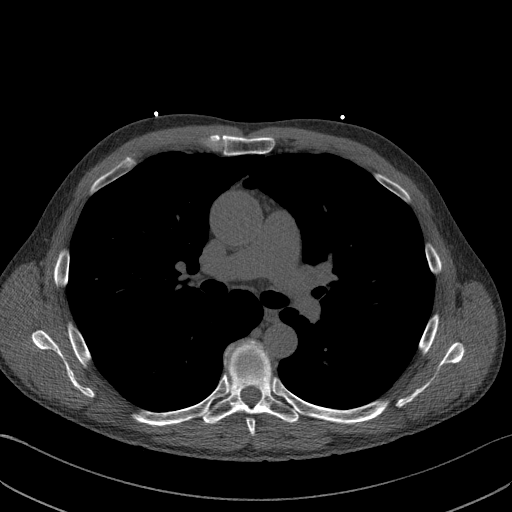
[im 63/76  lung]
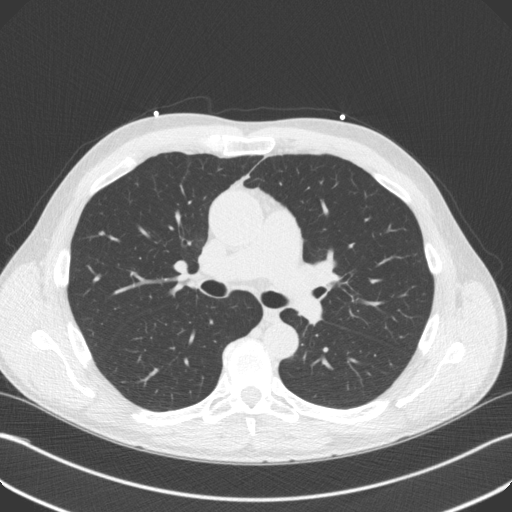

[Series 4: cascseq 2.0 br59 lung · axial · 0.73mm/px · z∈[-283,-183]mm · 5 of 76 slices shown]
[im 13/76  lung]
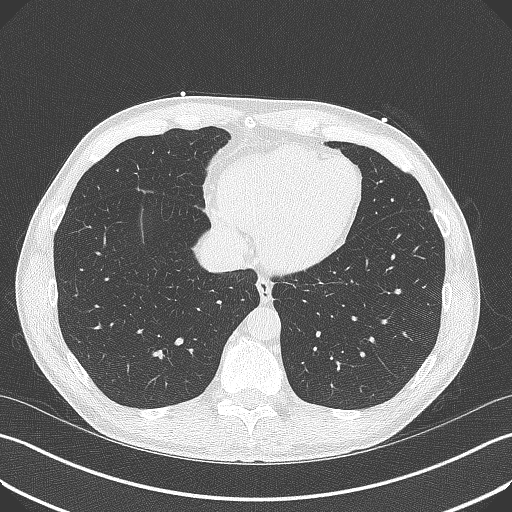
[im 26/76  lung]
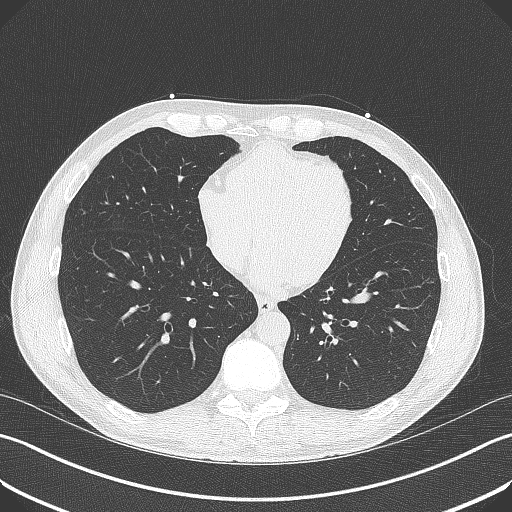
[im 38/76  lung]
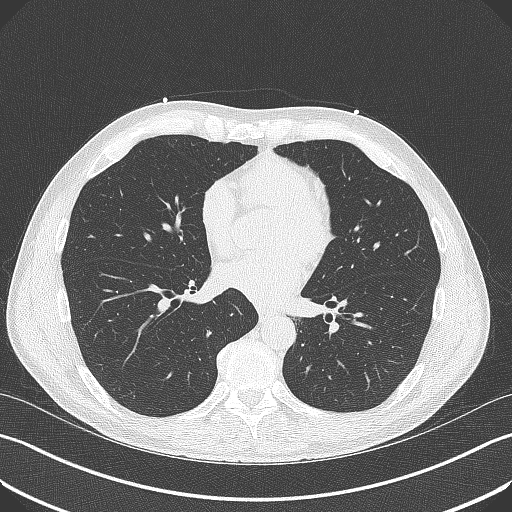
[im 51/76  lung]
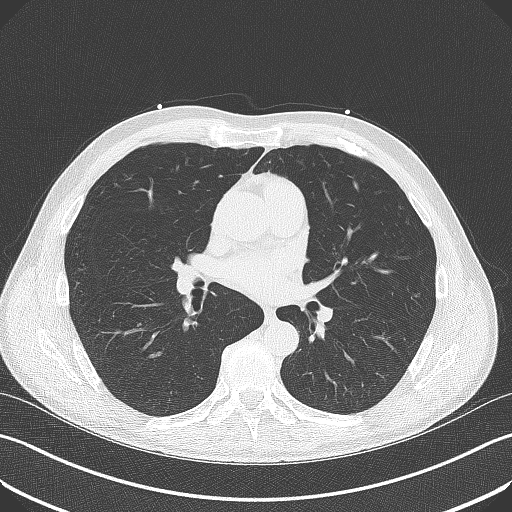
[im 63/76  lung]
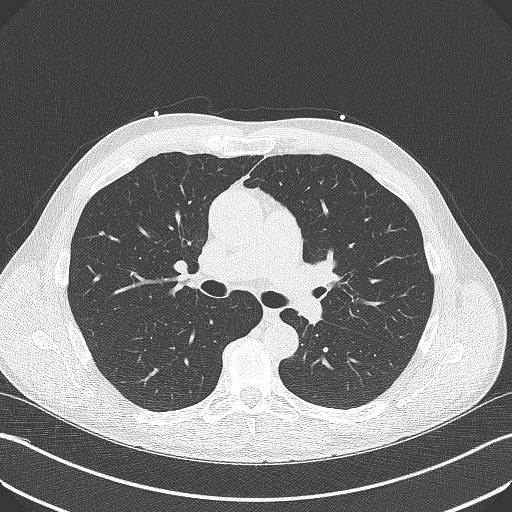

[14 of 20 positions shown; findings below may reference images not displayed]

FINDINGS: Within the visualized portions of the thorax there are no suspicious
appearing pulmonary nodules or masses, there is no acute
consolidative airspace disease, no pleural effusions, no
pneumothorax and no lymphadenopathy. Visualized portions of the
upper abdomen are unremarkable. There are no aggressive appearing
lytic or blastic lesions noted in the visualized portions of the
skeleton.
IMPRESSION: 1. No significant incidental noncardiac findings are noted.
FINDINGS: Normal coronary artery origins.

Coronary Calcium Score:

Left main: 0

Left anterior descending artery: 176

Left circumflex artery: 0

Right coronary artery: 0

Total: 176

Percentile: 65th

Pericardium: Normal.

Ascending Aorta: Borderline dilation on noncontrast exam. Ascending
aorta measures approximately 39mm at the mid ascending aorta
measured in an axial plane.

Non-cardiac: See separate report from [REDACTED].
IMPRESSION: 1. Coronary calcium score of 176. This was 65th percentile for age-,
race-, and sex-matched controls.

2. Borderline dilation of mid ascending aorta, 39 mm at mid
ascending aorta on non-contrast exam.



If CAC=0, it is reasonable to withhold statin therapy and reassess
in 5 to 10 years, as long as higher risk conditions are absent
(diabetes mellitus, family history of premature CHD in first degree
relatives (males <55 years; females <65 years), cigarette smoking,
or LDL >=190 mg/dL).

If CAC is 1 to 99, it is reasonable to initiate statin therapy for
patients >=55 years of age.

If CAC is >=100 or >=75th percentile, it is reasonable to initiate
statin therapy at any age.

Cardiology referral should be considered for patients with CAC
scores >=400 or >=75th percentile.

*9306 AHA/ACC/AACVPR/AAPA/ABC/MANSUR/PAULUS N/HONCHO/Roberto/SALLIE/LAZIE LOVE/SATHVARA
Guideline on the Management of Blood Cholesterol: A Report of the
American College of Cardiology/American Heart Association Task Force
on Clinical Practice Guidelines. J Am Coll Cardiol.
3943;73(24):6143-6011.

*** End of Addendum ***
EXAM:
OVER-READ INTERPRETATION  CT CHEST

The following report is an over-read performed by radiologist Dr.
Varghese Milano [REDACTED] on 04/06/2021. This
over-read does not include interpretation of cardiac or coronary
anatomy or pathology. The coronary calcium score interpretation by
the cardiologist is attached.
FINDINGS: Within the visualized portions of the thorax there are no suspicious
appearing pulmonary nodules or masses, there is no acute
consolidative airspace disease, no pleural effusions, no
pneumothorax and no lymphadenopathy. Visualized portions of the
upper abdomen are unremarkable. There are no aggressive appearing
lytic or blastic lesions noted in the visualized portions of the
skeleton.
IMPRESSION: 1. No significant incidental noncardiac findings are noted.

## 2022-01-14 ENCOUNTER — Encounter: Payer: Self-pay | Admitting: Family Medicine

## 2022-02-01 ENCOUNTER — Encounter: Payer: Self-pay | Admitting: *Deleted

## 2022-02-26 ENCOUNTER — Encounter: Payer: Self-pay | Admitting: Internal Medicine

## 2022-03-08 ENCOUNTER — Encounter: Payer: BC Managed Care – PPO | Admitting: Family Medicine

## 2022-03-30 ENCOUNTER — Encounter: Payer: Self-pay | Admitting: Family Medicine

## 2022-03-30 ENCOUNTER — Ambulatory Visit (INDEPENDENT_AMBULATORY_CARE_PROVIDER_SITE_OTHER): Payer: BC Managed Care – PPO | Admitting: Family Medicine

## 2022-03-30 VITALS — BP 121/83 | HR 68 | Temp 97.2°F | Resp 16 | Ht 70.5 in | Wt 171.6 lb

## 2022-03-30 DIAGNOSIS — Z0000 Encounter for general adult medical examination without abnormal findings: Secondary | ICD-10-CM

## 2022-03-30 DIAGNOSIS — R053 Chronic cough: Secondary | ICD-10-CM

## 2022-03-30 DIAGNOSIS — E785 Hyperlipidemia, unspecified: Secondary | ICD-10-CM | POA: Diagnosis not present

## 2022-03-30 DIAGNOSIS — Z125 Encounter for screening for malignant neoplasm of prostate: Secondary | ICD-10-CM

## 2022-03-30 DIAGNOSIS — I7121 Aneurysm of the ascending aorta, without rupture: Secondary | ICD-10-CM

## 2022-03-30 LAB — LIPID PANEL
Cholesterol: 195 mg/dL (ref 0–200)
HDL: 58.2 mg/dL (ref >39.00)
LDL Cholesterol: 111 mg/dL — ABNORMAL HIGH (ref 0–99)
NonHDL: 136.76
Total CHOL/HDL Ratio: 3
Triglycerides: 128 mg/dL (ref 0.0–149.0)
VLDL: 25.6 mg/dL (ref 0.0–40.0)

## 2022-03-30 LAB — CBC WITH DIFFERENTIAL/PLATELET
Basophils Absolute: 0 10*3/uL (ref 0.0–0.1)
Basophils Relative: 0.8 % (ref 0.0–3.0)
Eosinophils Absolute: 0.1 10*3/uL (ref 0.0–0.7)
Eosinophils Relative: 1.6 % (ref 0.0–5.0)
HCT: 46.5 % (ref 39.0–52.0)
Hemoglobin: 15.7 g/dL (ref 13.0–17.0)
Lymphocytes Relative: 23.2 % (ref 12.0–46.0)
Lymphs Abs: 1.5 10*3/uL (ref 0.7–4.0)
MCHC: 33.7 g/dL (ref 30.0–36.0)
MCV: 92.4 fl (ref 78.0–100.0)
Monocytes Absolute: 0.7 10*3/uL (ref 0.1–1.0)
Monocytes Relative: 11.3 % (ref 3.0–12.0)
Neutro Abs: 4 10*3/uL (ref 1.4–7.7)
Neutrophils Relative %: 63.1 % (ref 43.0–77.0)
Platelets: 245 10*3/uL (ref 150.0–400.0)
RBC: 5.03 Mil/uL (ref 4.22–5.81)
RDW: 13.2 % (ref 11.5–15.5)
WBC: 6.3 10*3/uL (ref 4.0–10.5)

## 2022-03-30 LAB — COMPREHENSIVE METABOLIC PANEL
ALT: 16 U/L (ref 0–53)
AST: 18 U/L (ref 0–37)
Albumin: 4.2 g/dL (ref 3.5–5.2)
Alkaline Phosphatase: 60 U/L (ref 39–117)
BUN: 14 mg/dL (ref 6–23)
CO2: 31 mEq/L (ref 19–32)
Calcium: 9 mg/dL (ref 8.4–10.5)
Chloride: 100 mEq/L (ref 96–112)
Creatinine, Ser: 1.06 mg/dL (ref 0.40–1.50)
GFR: 73.05 mL/min (ref >60.00)
Glucose, Bld: 90 mg/dL (ref 70–99)
Potassium: 4.3 mEq/L (ref 3.5–5.1)
Sodium: 136 mEq/L (ref 135–145)
Total Bilirubin: 0.6 mg/dL (ref 0.2–1.2)
Total Protein: 6.7 g/dL (ref 6.0–8.3)

## 2022-03-30 LAB — PSA: PSA: 0.83 ng/mL (ref 0.10–4.00)

## 2022-03-30 MED ORDER — TRAZODONE HCL 50 MG PO TABS: 25.0000 mg | ORAL_TABLET | Freq: Every evening | ORAL | 3 refills | Status: DC | PRN

## 2022-03-30 NOTE — Patient Instructions (Addendum)
Please stop by lab before you go If you have mychart- we will send your results within 3 business days of [place] receiving them.  If you do not have mychart- we will call you about results within 5 business days of [place] receiving them.  *please also note that you will see labs on mychart as soon as they post. I will later go in and write notes on them- will say "notes from Dr. Yong Channel"   We will call you within two weeks about your referral to ct angiogram to follow up borderline/possible aneurysm through [facility].  Their phone number is [phone].  Please call them if you have not heard in 1-2 weeks  Please go to [facility]  - located [address] across the street from [place] - in the basement - Hours: 8:30-5:00 PM M-F (with lunch from 12:30- 1 PM). You do NOT need an appointment.   Trazodone trial at night for sleep  Recommended follow up: Return in about 1 year (around 03/31/2023) for physical or sooner if needed.Schedule b4 you leave.

## 2022-03-30 NOTE — Progress Notes (Signed)
Phone: [phone]   Subjective:  Patient presents today for their annual physical. Chief complaint-noted.   See problem oriented charting- ROS- full  review of systems was completed and negative  except for: ongoing dry cough  The following were reviewed and entered/updated in epic: Past Medical History:  Diagnosis Date   Allergy    Carpal tunnel syndrome    left   Injury of flexor tendon of left hand    left long finger. cleaning a fish- injury- cant bend DIP left 3rd finger   Prostatitis    he feels like standing desk helps   Patient Active Problem List   Diagnosis Date Noted   Hyperlipidemia 11/06/2021    Priority: Medium    CAD (coronary artery disease) 11/06/2021    Priority: Medium    Insomnia 03/24/2018    Priority: Low   Carpal tunnel syndrome     Priority: Low   History of adenomatous polyp of colon 04/15/2008    Priority: Low   Past Surgical History:  Procedure Laterality Date   CARPAL TUNNEL RELEASE Left    COLONOSCOPY     FLEXOR TENDON REPAIR Left 04/17/2015   Procedure: LEFT LONG FINGER FLEXOR TENDON REPAIR POSSIBLE GRAFT;  Surgeon: Leanora Cover, MD;  Location: [facility];  Service: Orthopedics;  Laterality: Left;   POLYPECTOMY      Family History  Problem Relation Age of Onset   Other Mother        wheelchair bound- overweight, never exercised-lived to 12.   Heart disease Father        former smoker through age 78. lived to almost 95.   Pneumonia Father        died summer 2018. died at 2.    Obesity Sister    Hypertension Sister    Obesity Sister    ALS Brother    Colon cancer Neg Hx    Stomach cancer Neg Hx    Rectal cancer Neg Hx    Colon polyps Neg Hx    Esophageal cancer Neg Hx     Medications- reviewed and updated Current Outpatient Medications  Medication Sig Dispense Refill   rosuvastatin (CRESTOR) 10 MG tablet Take 1 tablet (10 mg total) by mouth daily. (Patient not taking: Reported on 03/30/2022) 90 tablet 3    No current facility-administered medications for this visit.    Allergies-reviewed and updated No Known Allergies  Social History   Social History Narrative   Married. 5 kids ranging form 22 to 32 in 2022. 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 Son Jenny Reichmann works with him.       Hobbies: travel, fishing, walking, hiking, movies    Objective  Objective:  BP 121/83   Pulse 68   Temp (!) 97.2 F (36.2 C) (Temporal)   Resp 16   Ht 5' 10.5" (1.791 m)   Wt 171 lb 9.6 oz (77.8 kg)   SpO2 98%   BMI 24.27 kg/m  Gen: NAD, resting comfortably HEENT: Mucous membranes are moist. Oropharynx normal Neck: no thyromegaly CV: RRR no murmurs rubs or gallops Lungs: CTAB no crackles, wheeze, rhonchi Abdomen: soft/nontender/nondistended/normal bowel sounds. No rebound or guarding.  Ext: no edema Skin: warm, dry Neuro: grossly normal, moves all extremities, PERRLA    Assessment and Plan  66 y.o. male presenting for annual physical.  Health Maintenance counseling: 1. Anticipatory guidance: Patient counseled regarding regular dental exams -q6 months, eye exams -  yearly,  avoiding smoking and second hand smoke, limiting alcohol to 2 beverages per day- 2 or less per week, no illicit drugs .   2. Risk factor reduction:  Advised patient of need for regular exercise and diet rich and fruits and vegetables to reduce risk of heart attack and stroke.  Exercise- [employer] 4-5 days a week and active at home as well.  Diet/weight management-within 4 pounds of last physical-healthy weight.  Wt Readings from Last 3 Encounters:  03/30/22 171 lb 9.6 oz (77.8 kg)  11/06/21 171 lb 9.6 oz (77.8 kg)  03/05/21 167 lb 9.6 oz (76 kg)  3. Immunizations/screenings/ancillary studies- holing off on flu and covid shot Immunization History  Administered Date(s) Administered   Fluad Quad(high Dose 65+) 03/05/2021   Influenza Inj Mdck Quad With Preservative 03/01/2019    Influenza Whole 03/05/2009, 02/23/2010   Influenza,inj,Quad PF,6+ Mos 04/09/2013, 04/09/2014, 04/07/2015, 03/14/2017, 03/24/2018, 03/12/2020   PFIZER Comirnaty(Gray Top)Covid-19 Tri-Sucrose Vaccine 09/30/2020   PFIZER(Purple Top)SARS-COV-2 Vaccination 07/23/2019, 08/14/2019, 03/12/2020   PNEUMOCOCCAL CONJUGATE-20 03/05/2021   Td 05/11/2007   Tdap 03/24/2018   Zoster Recombinat (Shingrix) 03/27/2019, 07/10/2019  4. Prostate cancer screening- low risk prior PSA trend-update with labs today.  Stable nocturia 3 times a night Lab Results  Component Value Date   PSA 0.70 03/05/2021   PSA 0.73 03/31/2020   PSA 0.68 03/27/2019   5. Colon cancer screening - History of adenomatous polyp of the colon-03/15/2017 with 5-year repeat planned-he is now due- will call to schedule early next year  6. Skin cancer screening- few years since visit to derm. advised regular sunscreen use. Denies worrisome, changing, or new skin lesions.  7. Smoking associated screening (lung cancer screening, AAA screen 65-75, UA)- never smoker 8. STD screening - only active with wife  Status of chronic or acute concerns   #Coronary artery calcium-nonobstructive with CT calcium scoring November 2022 at 176 which was 65th percentile  #hyperlipidemia with LDL goal under 70 S: Medication:Rosuvastatin 10 mg with LDL before treatment at 110- but has been off since 01/14/22 due to tendonitis issues- saw SM but finally improved and stayed of fstatin No chest pain or shortness of breath  Lab Results  Component Value Date   CHOL 183 03/05/2021   HDL 59.90 03/05/2021   LDLCALC 110 (H) 03/05/2021   LDLDIRECT 59.0 11/06/2021   TRIG 63.0 03/05/2021   CHOLHDL 3 03/05/2021  A/P: Coronary artery disease is asymptomatic.  Nonobstructive.  Currently not on any medication-ideal LDL would be under 70-we will update lipid panel today and if above goal can restart rosuvastatin-if improved from 110 before statin last year could even consider  just a 5 mg dose   #Borderline dilation of ascending aorta on CT calcium scoring at 39 mm- CTA ordered today -would avoid quinolones- counseled today  #Chronic cough-ongoing since September.  Described as dry. No shortness of breath or chest pain. Wife has similar and neither can shake. Tested negative for covid.  - considered prednisone and tessalon to suppress possible post viral cough- will consider later   #Insomnia- ongoing issues but is just tolerating- prefers to avoid meds particularly ambien type but after discussion willing to trial trazodone  #Burning mouth syndrome-noted at June visit-we looked for vitamin deficiencies but were unable to find any-discussed diluted [place] option to desensitize but ended up going away on its own   Recommended follow up: Return in about 1 year (around 03/31/2023) for physical or sooner if needed.Schedule b4 you leave.  Lab/Order associations:  NOT fasting   ICD-10-CM   1. Preventative health care  Z00.00     2. Hyperlipidemia, unspecified hyperlipidemia type  E78.5     3. Screening for prostate cancer  Z12.5      No orders of the defined types were placed in this encounter.   Return precautions advised.  Garret Reddish, MD

## 2022-03-31 ENCOUNTER — Ambulatory Visit (INDEPENDENT_AMBULATORY_CARE_PROVIDER_SITE_OTHER)
Admission: RE | Admit: 2022-03-31 | Discharge: 2022-03-31 | Disposition: A | Discharge status: Home / Self Care | Payer: BC Managed Care – PPO | Source: Ambulatory Visit | Attending: Family Medicine | Admitting: Family Medicine

## 2022-03-31 DIAGNOSIS — Z0000 Encounter for general adult medical examination without abnormal findings: Secondary | ICD-10-CM

## 2022-03-31 DIAGNOSIS — R053 Chronic cough: Secondary | ICD-10-CM

## 2022-04-06 ENCOUNTER — Telehealth: Payer: Self-pay | Admitting: Family Medicine

## 2022-04-07 ENCOUNTER — Ambulatory Visit
Admission: RE | Admit: 2022-04-07 | Discharge: 2022-04-07 | Disposition: A | Discharge status: Home / Self Care | Payer: BC Managed Care – PPO | Source: Ambulatory Visit | Attending: Family Medicine | Admitting: Family Medicine

## 2022-04-07 DIAGNOSIS — I7121 Aneurysm of the ascending aorta, without rupture: Secondary | ICD-10-CM

## 2022-04-07 DIAGNOSIS — Z0000 Encounter for general adult medical examination without abnormal findings: Secondary | ICD-10-CM

## 2022-04-07 MED ORDER — IOPAMIDOL (ISOVUE-370) INJECTION 76%
75.0000 mL | Freq: Once | INTRAVENOUS | Status: AC | PRN
  Administered 2022-04-07: 75 mL via INTRAVENOUS

## 2022-04-08 NOTE — Telephone Encounter (Signed)
error 

## 2022-04-10 ENCOUNTER — Other Ambulatory Visit: Payer: Self-pay | Admitting: Family Medicine

## 2022-04-13 ENCOUNTER — Encounter: Payer: Self-pay | Admitting: Family Medicine

## 2022-04-13 MED ORDER — BENZONATATE 100 MG PO CAPS: 100.0000 mg | ORAL_CAPSULE | Freq: Two times a day (BID) | ORAL | 0 refills | Status: DC | PRN

## 2022-04-13 MED ORDER — PREDNISONE 20 MG PO TABS: ORAL_TABLET | ORAL | 0 refills | Status: DC

## 2022-04-20 ENCOUNTER — Encounter: Payer: Self-pay | Admitting: Family Medicine

## 2022-04-29 ENCOUNTER — Ambulatory Visit: Payer: BC Managed Care – PPO | Admitting: Family Medicine

## 2022-05-13 ENCOUNTER — Encounter: Payer: Self-pay | Admitting: Internal Medicine

## 2022-05-27 ENCOUNTER — Telehealth: Payer: Self-pay

## 2022-05-27 ENCOUNTER — Ambulatory Visit (AMBULATORY_SURGERY_CENTER): Payer: BC Managed Care – PPO

## 2022-05-27 VITALS — Ht 70.5 in | Wt 172.0 lb

## 2022-05-27 DIAGNOSIS — Z86010 Personal history of colonic polyps: Secondary | ICD-10-CM

## 2022-05-27 MED ORDER — NA SULFATE-K SULFATE-MG SULF 17.5-3.13-1.6 GM/177ML PO SOLN: 1.0000 | Freq: Once | ORAL | 0 refills | Status: AC

## 2022-05-27 NOTE — Telephone Encounter (Signed)
Patient returned call

## 2022-05-27 NOTE — Progress Notes (Signed)

## 2022-05-27 NOTE — Telephone Encounter (Signed)
PV RN returned patient call; PV appt completed at patient's request;

## 2022-05-27 NOTE — Telephone Encounter (Signed)
Patient called at 10:11 AM unable to reach pre visit room after two attempts. Advised patient a Pre-visit nurse would call him back.

## 2022-05-27 NOTE — Telephone Encounter (Signed)
Called to complete pt PV at 1002 and again at 1010 with no answer on both attempts. Left pt voicemail that he would need to call the office by 5 PM to reschedule his PV. Also made patient aware that in the event we don't receive a call back by 5 PM we would have to cancel his PV and colonoscopy.

## 2022-06-10 ENCOUNTER — Encounter: Payer: Self-pay | Admitting: Internal Medicine

## 2022-06-13 ENCOUNTER — Encounter: Payer: Self-pay | Admitting: Family Medicine

## 2022-06-14 ENCOUNTER — Other Ambulatory Visit: Payer: Self-pay

## 2022-06-14 DIAGNOSIS — R238 Other skin changes: Secondary | ICD-10-CM

## 2022-06-17 ENCOUNTER — Encounter: Payer: BC Managed Care – PPO | Admitting: Internal Medicine

## 2022-06-21 ENCOUNTER — Telehealth: Payer: Self-pay | Admitting: Internal Medicine

## 2022-06-21 NOTE — Telephone Encounter (Signed)
Called patient, he was previously scheduled for a 3:30pm appt, and he wanted to know if he could have a light breakfast today.  Advised patient that he could not have solid food today since his appt was scheduled for 8:30am. Went over time to start his prep for this evening and tomorrow morning. He verbalized understanding and had no further concerns at the end of the call.

## 2022-06-21 NOTE — Telephone Encounter (Signed)
Inbound call from patient stating that he is scheduled to have a colonoscopy tomorrow at 8:30 with Dr. Henrene Pastor and has questions about his prep instructions. Please advise.

## 2022-06-22 ENCOUNTER — Ambulatory Visit (AMBULATORY_SURGERY_CENTER): Payer: BC Managed Care – PPO | Admitting: Internal Medicine

## 2022-06-22 ENCOUNTER — Encounter: Payer: Self-pay | Admitting: Internal Medicine

## 2022-06-22 VITALS — BP 133/83 | HR 63 | Temp 97.8°F | Resp 11 | Ht 70.5 in | Wt 171.0 lb

## 2022-06-22 DIAGNOSIS — Z86010 Personal history of colonic polyps: Secondary | ICD-10-CM

## 2022-06-22 DIAGNOSIS — Z09 Encounter for follow-up examination after completed treatment for conditions other than malignant neoplasm: Secondary | ICD-10-CM

## 2022-06-22 DIAGNOSIS — K635 Polyp of colon: Secondary | ICD-10-CM

## 2022-06-22 DIAGNOSIS — D124 Benign neoplasm of descending colon: Secondary | ICD-10-CM

## 2022-06-22 MED ORDER — SODIUM CHLORIDE 0.9 % IV SOLN: 500.0000 mL | INTRAVENOUS | Status: DC

## 2022-06-22 NOTE — Progress Notes (Signed)
HISTORY OF PRESENT ILLNESS:  Ronald Gardner is a 67 y.o. male with a personal history of nonadvanced adenomatous colon polyps.  Presents today for surveillance  REVIEW OF SYSTEMS:  All non-GI ROS negative except for  Past Medical History:  Diagnosis Date   Allergy    Carpal tunnel syndrome    left   Hyperlipidemia    Injury of flexor tendon of left hand    left long finger. cleaning a fish- injury- cant bend DIP left 3rd finger   Prostatitis    he feels like standing desk helps    Past Surgical History:  Procedure Laterality Date   CARPAL TUNNEL RELEASE Left    COLONOSCOPY  03/15/2017   FLEXOR TENDON REPAIR Left 04/17/2015   Procedure: LEFT LONG FINGER FLEXOR TENDON REPAIR POSSIBLE GRAFT;  Surgeon: Leanora Cover, MD;  Location: [facility];  Service: Orthopedics;  Laterality: Left;   POLYPECTOMY      Social History [name]  reports that he has never smoked. He has never used smokeless tobacco. He reports current alcohol use of about 2.0 - 3.0 standard drinks of alcohol per week. He reports that he does not use drugs.  family history includes ALS in his brother; Heart disease in his father; Hypertension in his sister; Obesity in his sister and sister; Other in his mother; Pneumonia in his father.  No Known Allergies     PHYSICAL EXAMINATION: Vital signs: BP (!) 96/94   Pulse 75   Temp 97.8 F (36.6 C)   Ht 5' 10.5" (1.791 m)   Wt 171 lb (77.6 kg)   SpO2 96%   BMI 24.19 kg/m  General: Well-developed, well-nourished, no acute distress HEENT: Sclerae are anicteric, conjunctiva pink. Oral mucosa intact Lungs: Clear Heart: Regular Abdomen: soft, nontender, nondistended, no obvious ascites, no peritoneal signs, normal bowel sounds. No organomegaly. Extremities: No edema Psychiatric: alert and oriented x3. Cooperative     ASSESSMENT:  History of adenomatous polyps   PLAN:  Surveillance colonoscopy

## 2022-06-22 NOTE — Op Note (Signed)
[facility] Patient Name: Ronald Gardner Procedure Date: 06/22/2022 8:22 AM MRN: [id number] Endoscopist: Docia Chuck. Henrene Pastor , MD, [id number] Age: 67 Referring MD:  Date of Birth: 1955/07/01 Gender: Male Account #: [id number] Procedure:                Colonoscopy with cold snare polypectomy x 2 Indications:              High risk colon cancer surveillance: Personal                            history of non-advanced adenomas. Previous                            examinations 2007, 2013, 2018 Medicines:                Monitored Anesthesia Care Procedure:                Pre-Anesthesia Assessment:                           - Prior to the procedure, a History and Physical                            was performed, and patient medications and                            allergies were reviewed. The patient's tolerance of                            previous anesthesia was also reviewed. The risks                            and benefits of the procedure and the sedation                            options and risks were discussed with the patient.                            All questions were answered, and informed consent                            was obtained. Prior Anticoagulants: The patient has                            taken no anticoagulant or antiplatelet agents. ASA                            Grade Assessment: II - A patient with mild systemic                            disease. After reviewing the risks and benefits,                            the patient was deemed in satisfactory condition to  undergo the procedure.                           After obtaining informed consent, the colonoscope                            was passed under direct vision. Throughout the                            procedure, the patient's blood pressure, pulse, and                            oxygen saturations were monitored continuously. The                            CF  [id number] was introduced through the anus                            and advanced to the the cecum, identified by                            appendiceal orifice and ileocecal valve. The                            ileocecal valve, appendiceal orifice, and rectum                            were photographed. The quality of the bowel                            preparation was excellent. The colonoscopy was                            performed without difficulty. The patient tolerated                            the procedure well. The bowel preparation used was                            SUPREP via split dose instruction. Scope In: 8:52:32 AM Scope Out: 9:08:03 AM Scope Withdrawal Time: 0 hours 12 minutes 15 seconds  Total Procedure Duration: 0 hours 15 minutes 31 seconds  Findings:                 Two sessile polyps were found in the descending                            colon. The polyps were 2 to 5 mm in size. These                            polyps were removed with a cold snare. Resection                            and retrieval were complete.  A few diverticula were found in the sigmoid colon.                           Internal hemorrhoids were found during retroflexion.                           The terminal ileum appeared normal.                           The exam was otherwise without abnormality on                            direct and retroflexion views. Complications:            No immediate complications. Estimated blood loss:                            None. Estimated Blood Loss:     Estimated blood loss: none. Impression:               - Two 2 to 5 mm polyps in the descending colon,                            removed with a cold snare. Resected and retrieved.                           - Diverticulosis in the sigmoid colon.                           - Internal hemorrhoids. Normal terminal ileum.                           - The examination was  otherwise normal on direct                            and retroflexion views. Recommendation:           - Repeat colonoscopy in 5 years for surveillance.                           - Patient has a contact number available for                            emergencies. The signs and symptoms of potential                            delayed complications were discussed with the                            patient. Return to normal activities tomorrow.                            Written discharge instructions were provided to the                            patient.                           -  Resume previous diet.                           - Continue present medications.                           - Await pathology results. Docia Chuck. Henrene Pastor, MD 06/22/2022 9:15:28 AM This report has been signed electronically.

## 2022-06-22 NOTE — Patient Instructions (Signed)
YOU HAD AN ENDOSCOPIC PROCEDURE TODAY AT [facility]:   Refer to the procedure report that was given to you for any specific questions about what was found during the examination.  If the procedure report does not answer your questions, please call your gastroenterologist to clarify.  If you requested that your care partner not be given the details of your procedure findings, then the procedure report has been included in a sealed envelope for you to review at your convenience later.  **Handouts given on polyps, diverticulosis and hemorrhoids**  YOU SHOULD EXPECT: Some feelings of bloating in the abdomen. Passage of more gas than usual.  Walking can help get rid of the air that was put into your GI tract during the procedure and reduce the bloating. If you had a lower endoscopy (such as a colonoscopy or flexible sigmoidoscopy) you may notice spotting of blood in your stool or on the toilet paper. If you underwent a bowel prep for your procedure, you may not have a normal bowel movement for a few days.  Please Note:  You might notice some irritation and congestion in your nose or some drainage.  This is from the oxygen used during your procedure.  There is no need for concern and it should clear up in a day or so.  SYMPTOMS TO REPORT IMMEDIATELY:  Following lower endoscopy (colonoscopy or flexible sigmoidoscopy):  Excessive amounts of blood in the stool  Significant tenderness or worsening of abdominal pains  Swelling of the abdomen that is new, acute  Fever of 100F or higher  For urgent or emergent issues, a gastroenterologist can be reached at any hour by calling [phone]. Do not use MyChart messaging for urgent concerns.    DIET:  We do recommend a small meal at first, but then you may proceed to your regular diet.  Drink plenty of fluids but you should avoid alcoholic beverages for 24 hours.  ACTIVITY:  You should plan to take it easy for the rest of today and you  should NOT DRIVE or use heavy machinery until tomorrow (because of the sedation medicines used during the test).    FOLLOW UP: Our staff will call the number listed on your records the next business day following your procedure.  We will call around 7:15- 8:00 am to check on you and address any questions or concerns that you may have regarding the information given to you following your procedure. If we do not reach you, we will leave a message.     If any biopsies were taken you will be contacted by phone or by letter within the next 1-3 weeks.  Please call us at [phone] if you have not heard about the biopsies in 3 weeks.    SIGNATURES/CONFIDENTIALITY: You and/or your care partner have signed paperwork which will be entered into your electronic medical record.  These signatures attest to the fact that that the information above on your After Visit Summary has been reviewed and is understood.  Full responsibility of the confidentiality of this discharge information lies with you and/or your care-partner.

## 2022-06-22 NOTE — Progress Notes (Signed)
Vss nad trans to pacu °

## 2022-06-22 NOTE — Progress Notes (Signed)
Patient reports no changes to health or medications since pre visit

## 2022-06-22 NOTE — Progress Notes (Signed)
Called to room to assist during endoscopic procedure.  Patient ID and intended procedure confirmed with present staff. Received instructions for my participation in the procedure from the performing physician.  

## 2022-06-23 ENCOUNTER — Telehealth: Payer: Self-pay | Admitting: *Deleted

## 2022-06-23 NOTE — Telephone Encounter (Signed)
  Follow up Call-     06/22/2022    7:57 AM  Call back number  Post procedure Call Back phone  # [phone]  Permission to leave phone message Yes   [facility]

## 2022-06-28 ENCOUNTER — Encounter: Payer: Self-pay | Admitting: Family Medicine

## 2022-06-30 ENCOUNTER — Encounter: Payer: Self-pay | Admitting: *Deleted

## 2022-06-30 NOTE — Research (Unsigned)
Message left for Ronald Gardner bout V1P. Encouraged him to call with any questions.

## 2022-07-02 ENCOUNTER — Encounter: Payer: Self-pay | Admitting: Internal Medicine

## 2022-10-13 ENCOUNTER — Encounter: Payer: Self-pay | Admitting: Family Medicine

## 2022-10-13 NOTE — Telephone Encounter (Signed)
Spoke with pt. He wants to wait until next week & then call us Monday to scheduled if needed.

## 2022-10-27 ENCOUNTER — Ambulatory Visit: Payer: BC Managed Care – PPO | Admitting: Family Medicine

## 2022-11-08 ENCOUNTER — Encounter: Payer: Self-pay | Admitting: Family Medicine

## 2022-11-09 ENCOUNTER — Telehealth: Payer: BC Managed Care – PPO | Admitting: Family Medicine

## 2022-11-09 NOTE — Telephone Encounter (Signed)
FYI

## 2022-11-09 NOTE — Telephone Encounter (Signed)
Patient called and explained confusion in regards to 2pm visit. I clarified with patient that he doesn't need to come in for the visit, it would be virtual. Patient then asked to cancel visit for today. I asked patient if he was sure and he stated he was.

## 2022-12-20 ENCOUNTER — Encounter: Payer: Self-pay | Admitting: Family Medicine

## 2022-12-23 ENCOUNTER — Encounter: Payer: Self-pay | Admitting: Family Medicine

## 2022-12-23 ENCOUNTER — Ambulatory Visit (INDEPENDENT_AMBULATORY_CARE_PROVIDER_SITE_OTHER): Payer: BC Managed Care – PPO | Admitting: Family Medicine

## 2022-12-23 VITALS — BP 118/88 | HR 74 | Temp 98.2°F | Ht 70.5 in | Wt 170.6 lb

## 2022-12-23 DIAGNOSIS — F5101 Primary insomnia: Secondary | ICD-10-CM | POA: Diagnosis not present

## 2022-12-23 MED ORDER — TAMSULOSIN HCL 0.4 MG PO CAPS: 0.4000 mg | ORAL_CAPSULE | Freq: Every day | ORAL | 3 refills | Status: DC

## 2022-12-23 NOTE — Patient Instructions (Addendum)
Nighttime awakenings seem to be driven by frequent urination at nighttime- for that reason  - we opted to trial tamsulosin 0.4 mg before bed - we discussed potential for lightheadedness with standing particularly the first day or two on the medicine- caution with quick position changes - we discussed as backup lunesta 1 mg trial to see if helped with sleep maintenance   Recommended follow up: Return for next already scheduled visit or sooner if needed.

## 2022-12-23 NOTE — Progress Notes (Signed)
  Phone [phone] In person visit   Subjective:   Ronald Gardner is a 67 y.o. year old very pleasant male patient who presents for/with See problem oriented charting Chief Complaint  Patient presents with   medications concerns    Pt is interested in starting on -Lunesta    Past Medical History-  Patient Active Problem List   Diagnosis Date Noted   Hyperlipidemia 11/06/2021    Priority: Medium    CAD (coronary artery disease) 11/06/2021    Priority: Medium    Insomnia 03/24/2018    Priority: Low   Carpal tunnel syndrome     Priority: Low   History of adenomatous polyp of colon 04/15/2008    Priority: Low    Medications- reviewed and updated Current Outpatient Medications  Medication Sig Dispense Refill   rosuvastatin (CRESTOR) 10 MG tablet TAKE 1 TABLET BY MOUTH EVERY DAY 90 tablet 3   tamsulosin (FLOMAX) 0.4 MG CAPS capsule Take 1 capsule (0.4 mg total) by mouth daily. 30 capsule 3   No current facility-administered medications for this visit.     Objective:  BP 118/88   Pulse 74   Temp 98.2 F (36.8 C)   Ht 5' 10.5" (1.791 m)   Wt 170 lb 9.6 oz (77.4 kg)   SpO2 94%   BMI 24.13 kg/m  Gen: NAD, resting comfortably CV: RRR no murmurs rubs or gallops Lungs: CTAB no crackles, wheeze, rhonchi Ext: no edema Skin: warm, dry     Assessment and Plan   # Insomnia S: Patient reports issues with insomnia starting years ago and worsening lately.  At physical 03/30/2022 he stated he preferred just to tolerate without medication or trial of CBT-I. - He was concerned about Ambien type medications but was open to potentially trying trazodone -even on a good night up 3-4 x a night but can fall sleep.  Think she may sleep all night if didn't have to wake up to urinate. A lot of stops urinating with urination on road trips.  - feels more numb in the day when doesn't sleep well and doesn't give as much emotional space with work or home- takes joy out of  things  -trazodone caused erection for several hours in middle of night and then had difficulty in the day with erection A/P: Nighttime awakenings seem to be driven by frequent urination at nighttime- for that reason  - we opted to trial tamsulosin 0.4 mg before bed - we discussed potential for lightheadedness with standing particularly the first day or two on the medicine- caution with quick position changes - we discussed as backup lunesta 1 mg trial to see if helped with sleep maintenance   #Eye floater- in left eye present for 10 days- encouraged as soon as possible visit with his eye doctor . No blurry vision or double vision.   Recommended follow up: Return for next already scheduled visit or sooner if needed. Future Appointments  Date Time Provider Department Center  03/07/2023 10:00 AM Shelva Majestic, MD [facility]    Lab/Order associations:   ICD-10-CM   1. Primary insomnia  F51.01       Meds ordered this encounter  Medications   tamsulosin (FLOMAX) 0.4 MG CAPS capsule    Sig: Take 1 capsule (0.4 mg total) by mouth daily.    Dispense:  30 capsule    Refill:  3    Return precautions advised.  Tana Conch, MD

## 2022-12-27 ENCOUNTER — Encounter: Payer: Self-pay | Admitting: Family Medicine

## 2022-12-27 MED ORDER — ESZOPICLONE 1 MG PO TABS: 1.0000 mg | ORAL_TABLET | Freq: Every evening | ORAL | 5 refills | Status: DC | PRN

## 2022-12-28 ENCOUNTER — Other Ambulatory Visit: Payer: Self-pay

## 2022-12-28 MED ORDER — ESZOPICLONE 1 MG PO TABS: 1.0000 mg | ORAL_TABLET | Freq: Every evening | ORAL | 5 refills | Status: DC | PRN

## 2023-01-06 ENCOUNTER — Encounter: Payer: Self-pay | Admitting: Family Medicine

## 2023-01-06 DIAGNOSIS — K4090 Unilateral inguinal hernia, without obstruction or gangrene, not specified as recurrent: Secondary | ICD-10-CM

## 2023-01-06 NOTE — Telephone Encounter (Signed)
Patient has been scheduled with Dr. Jimmey Ralph on 01/14/23 @ 7:20 am for evaluation.

## 2023-01-14 ENCOUNTER — Encounter: Payer: Self-pay | Admitting: Family Medicine

## 2023-01-14 ENCOUNTER — Ambulatory Visit (INDEPENDENT_AMBULATORY_CARE_PROVIDER_SITE_OTHER): Payer: BC Managed Care – PPO | Admitting: Family Medicine

## 2023-01-14 VITALS — BP 128/78 | HR 60 | Temp 97.5°F | Ht 70.5 in | Wt 169.2 lb

## 2023-01-14 DIAGNOSIS — M25562 Pain in left knee: Secondary | ICD-10-CM | POA: Diagnosis not present

## 2023-01-14 DIAGNOSIS — R1909 Other intra-abdominal and pelvic swelling, mass and lump: Secondary | ICD-10-CM | POA: Diagnosis not present

## 2023-01-14 NOTE — Progress Notes (Signed)
   Ronald Gardner is a 67 y.o. male who presents today for an office visit.  Assessment/Plan:  New/Acute Problems: Groin Lump Exam consistent with inguinal hernia.  Easily reducible on exam today without any signs of strangulation.  No other red flag signs or symptoms.  Will check ultrasound to confirm and to rule out other potential etiologies including lymphadenopathy.  Discussed with patient he will need to be referred to see a surgeon if ultrasound confirms hernia.  He is agreeable to this plan.  We discussed reasons to return to care and seek emergent care.  Knee pain Likely osteoarthritis.  No red flag signs or symptoms.  Recommended over-the-counter meds as needed.  Also recommend ice and compression.  Advised him to follow-up with PCP if not improving.    Subjective:  HPI:  Patient here with swelling in his left groin. This started about 1-2 weeks ago. First noticed it while cutting grass.  He does also have some swelling on right side as well. No pain but does feel "uncomfortable." No dysuria. No discharge. No fevers or chills. No recent illnesses.    Has also been having intermittent left knee pain.  Symptoms overall stable.  No specific treatments tried.  No obvious injuries or precipitating events.       Objective:  Physical Exam: BP 128/78   Pulse 60   Temp (!) 97.5 F (36.4 C) (Temporal)   Ht 5' 10.5" (1.791 m)   Wt 169 lb 3.2 oz (76.7 kg)   SpO2 96%   BMI 23.93 kg/m   Gen: No acute distress, resting comfortably CV: Regular rate and rhythm with no murmurs appreciated Pulm: Normal work of breathing, clear to auscultation bilaterally with no crackles, wheezes, or rhonchi GU: Palpable bulge in bilateral inguinal creases left greater than right.  Easily reducible.  Worsen with Valsalva. Neuro: Grossly normal, moves all extremities Psych: Normal affect and thought content      Lashayla Armes M. Jimmey Ralph, MD 01/14/2023 7:49 AM

## 2023-01-14 NOTE — Patient Instructions (Addendum)
It was very nice to see you today!  I think you probably have a hernia. We will check an ultrasound to confirm. We may need to send you to see a surgeon depending on the results.   Return if symptoms worsen or fail to improve.   Take care, Dr Jimmey Ralph  PLEASE NOTE:  If you had any lab tests, please let us know if you have not heard back within a few days. You may see your results on mychart before we have a chance to review them but we will give you a call once they are reviewed by [place].   If we ordered any referrals today, please let us know if you have not heard from their office within the next week.   If you had any urgent prescriptions sent in today, please check with the pharmacy within an hour of our visit to make sure the prescription was transmitted appropriately.   Please try these tips to maintain a healthy lifestyle:  Eat at least 3 REAL meals and 1-2 snacks per day.  Aim for no more than 5 hours between eating.  If you eat breakfast, please do so within one hour of getting up.   Each meal should contain half fruits/vegetables, one quarter protein, and one quarter carbs (no bigger than a computer mouse)  Cut down on sweet beverages. This includes juice, soda, and sweet tea.   Drink at least 1 glass of water with each meal and aim for at least 8 glasses per day  Exercise at least 150 minutes every week.

## 2023-01-18 ENCOUNTER — Encounter: Payer: Self-pay | Admitting: Family Medicine

## 2023-01-19 NOTE — Telephone Encounter (Signed)
Send information to [facility] referral coordinator, waiting for respond

## 2023-01-21 ENCOUNTER — Other Ambulatory Visit: Payer: Self-pay

## 2023-01-21 DIAGNOSIS — R1909 Other intra-abdominal and pelvic swelling, mass and lump: Secondary | ICD-10-CM

## 2023-01-21 NOTE — Telephone Encounter (Signed)
FYI, I have spoke with Misty Stanley about this. She has been out of office and Dr. Jimmey Ralph ordered this at the hospital initially which was the hold up so I had to re order for pt to have it done at GI. Misty Stanley is working on authorization now and getting it scheduled.

## 2023-01-28 ENCOUNTER — Ambulatory Visit
Admission: RE | Admit: 2023-01-28 | Discharge: 2023-01-28 | Disposition: A | Discharge status: Home / Self Care | Payer: BC Managed Care – PPO | Source: Ambulatory Visit | Attending: Family Medicine | Admitting: Family Medicine

## 2023-01-28 DIAGNOSIS — R1909 Other intra-abdominal and pelvic swelling, mass and lump: Secondary | ICD-10-CM

## 2023-02-23 ENCOUNTER — Telehealth: Payer: Self-pay | Admitting: Family Medicine

## 2023-02-23 NOTE — Telephone Encounter (Signed)
Thank you for your help but please disregard. Patient decided to wait for PCP next CPE, December 20th.

## 2023-02-23 NOTE — Telephone Encounter (Signed)
Patient's CPE with pcp had to be rescheduled due to PCP being on paternity leave. Patient states he needs CPE done before 11/10 so he can save money with his insurance company. Patient was offered an earlier CPE with Alyssa Allwardt, but he stated he would be more comfortable with a male provider only because of the prostate exam portion. Is it possible for you to do patient's CPE?

## 2023-03-07 ENCOUNTER — Encounter: Payer: BC Managed Care – PPO | Admitting: Family Medicine

## 2023-04-05 ENCOUNTER — Encounter: Payer: BC Managed Care – PPO | Admitting: Family Medicine

## 2023-04-12 ENCOUNTER — Encounter: Payer: Self-pay | Admitting: Family Medicine

## 2023-04-29 ENCOUNTER — Encounter: Payer: BC Managed Care – PPO | Admitting: Family Medicine

## 2023-05-26 ENCOUNTER — Encounter: Payer: Self-pay | Admitting: Family Medicine

## 2023-05-26 ENCOUNTER — Ambulatory Visit (INDEPENDENT_AMBULATORY_CARE_PROVIDER_SITE_OTHER): Payer: BC Managed Care – PPO | Admitting: Family Medicine

## 2023-05-26 VITALS — BP 129/79 | HR 77 | Temp 98.2°F | Ht 70.5 in | Wt 174.0 lb

## 2023-05-26 DIAGNOSIS — Z125 Encounter for screening for malignant neoplasm of prostate: Secondary | ICD-10-CM | POA: Diagnosis not present

## 2023-05-26 DIAGNOSIS — Z0000 Encounter for general adult medical examination without abnormal findings: Secondary | ICD-10-CM | POA: Diagnosis not present

## 2023-05-26 DIAGNOSIS — E785 Hyperlipidemia, unspecified: Secondary | ICD-10-CM | POA: Diagnosis not present

## 2023-05-26 NOTE — Patient Instructions (Addendum)
Schedule a lab visit at the check out desk within 2 weeks. Return for future fasting labs meaning nothing but water after midnight please. Ok to take your medications with water.   Mineral oil for ear full of wax OR debrox Purchase mineral oil from laxative aisle Lay down on your side with ear that is bothering you facing up Use 3-4 drops with a dropper and place in ear for 30 seconds Place cotton swab outside of ear Turn to other side and allow this to drain Repeat 3-4 x a day Return to see [place] if not improving within a few days  Recommended follow up: Return in about 1 year (around 05/25/2024) for physical or sooner if needed.Schedule b4 you leave.

## 2023-05-26 NOTE — Progress Notes (Signed)
Phone: [phone]   Subjective:  Patient presents today for their annual physical. Chief complaint-noted.   See problem oriented charting- ROS- full  review of systems was completed and negative  except for: swelling in groin after recent surgery with Dr. Annett Fabian  The following were reviewed and entered/updated in epic: Past Medical History:  Diagnosis Date   Allergy    Carpal tunnel syndrome    left   Hyperlipidemia    Injury of flexor tendon of left hand    left long finger. cleaning a fish- injury- cant bend DIP left 3rd finger   Prostatitis    he feels like standing desk helps   Patient Active Problem List   Diagnosis Date Noted   Hyperlipidemia 11/06/2021    Priority: Medium    CAD (coronary artery disease) 11/06/2021    Priority: Medium    Insomnia 03/24/2018    Priority: Low   Carpal tunnel syndrome     Priority: Low   History of adenomatous polyp of colon 04/15/2008    Priority: Low   Past Surgical History:  Procedure Laterality Date   CARPAL TUNNEL RELEASE Left    COLONOSCOPY  03/15/2017   FLEXOR TENDON REPAIR Left 04/17/2015   Procedure: LEFT LONG FINGER FLEXOR TENDON REPAIR POSSIBLE GRAFT;  Surgeon: Betha Loa, MD;  Location: [facility];  Service: Orthopedics;  Laterality: Left;   POLYPECTOMY      Family History  Problem Relation Age of Onset   Other Mother        wheelchair bound- overweight, never exercised-lived to 49.   Heart disease Father        former smoker through age 93. lived to almost 95.   Pneumonia Father        died summer 2018. died at 56.    Obesity Sister    Hypertension Sister    Obesity Sister    ALS Brother    Colon cancer Neg Hx    Stomach cancer Neg Hx    Rectal cancer Neg Hx    Colon polyps Neg Hx    Esophageal cancer Neg Hx     Medications- reviewed and updated Current Outpatient Medications  Medication Sig Dispense Refill   eszopiclone (LUNESTA) 1 MG TABS tablet Take 1 tablet (1 mg total) by mouth  at bedtime as needed for sleep (Do not drive for 8 hours after taking). Take immediately before bedtime 30 tablet 5   rosuvastatin (CRESTOR) 10 MG tablet TAKE 1 TABLET BY MOUTH EVERY DAY 90 tablet 3   No current facility-administered medications for this visit.    Allergies-reviewed and updated Allergies  Allergen Reactions   Trazodone And Nefazodone     Prolonged nighttime erection    Social History   Social History Narrative   Married. 5 kids ranging form 22 to 32 in 2022. 9 grandkids by 2025.       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       Hobbies: travel, fishing, walking, hiking, movies    Objective  Objective:  BP 129/79   Pulse 77   Temp 98.2 F (36.8 C)   Ht 5' 10.5" (1.791 m)   Wt 174 lb (78.9 kg)   SpO2 95%   BMI 24.61 kg/m  Gen: NAD, resting comfortably HEENT: Mucous membranes are moist. Oropharynx normal Neck: no thyromegaly CV: RRR no murmurs rubs or gallops Lungs: CTAB no crackles, wheeze, rhonchi Abdomen: soft/nontender/nondistended/normal bowel sounds. No rebound or guarding.  Ext: no edema Skin: warm, dry Neuro: grossly normal, moves all extremities, PERRLA Hernia scar is healing well-mildly tender to touch without surrounding warmth or erythema   Assessment and Plan  68 y.o. male presenting for annual physical.  Health Maintenance counseling: 1. Anticipatory guidance: Patient counseled regarding regular dental exams -q6 months, eye exams -yearly-does have floaters,  avoiding smoking and second hand smoke , limiting alcohol to 2 beverages per day - scotch 2-3 total per week, no illicit drugs.   2. Risk factor reduction:  Advised patient of need for regular exercise and diet rich and fruits and vegetables to reduce risk of heart attack and stroke.  Exercise- swimming and enjoying that and some walking but working through recent hernia surgery.  Diet/weight management-wants to get to 165 on home scales- up slightly  from last year.  Wt Readings from Last 3 Encounters:  05/26/23 174 lb (78.9 kg)  01/14/23 169 lb 3.2 oz (76.7 kg)  12/23/22 170 lb 9.6 oz (77.4 kg)  3. Immunizations/screenings/ancillary studies- holding off on COVID and flu shot Immunization History  Administered Date(s) Administered   Fluad Quad(high Dose 65+) 03/05/2021   Influenza Inj Mdck Quad With Preservative 03/01/2019   Influenza Whole 03/05/2009, 02/23/2010   Influenza,inj,Quad PF,6+ Mos 04/09/2013, 04/09/2014, 04/07/2015, 03/14/2017, 03/24/2018, 03/12/2020   PFIZER Comirnaty(Gray Top)Covid-19 Tri-Sucrose Vaccine 09/30/2020   PFIZER(Purple Top)SARS-COV-2 Vaccination 07/23/2019, 08/14/2019, 03/12/2020   PNEUMOCOCCAL CONJUGATE-20 03/05/2021   Td 05/11/2007   Tdap 03/24/2018   Zoster Recombinant(Shingrix) 03/27/2019, 07/10/2019   4. Prostate cancer screening- low risk prior trend- update psa today   Lab Results  Component Value Date   PSA 0.83 03/30/2022   PSA 0.70 03/05/2021   PSA 0.73 03/31/2020   5. Colon cancer screening - colonoscopy 06/22/22 with 5 year repeat due to polyp history 6. Skin cancer screening- went about a year ago Dr. Fatima Blank (daughter is married to one of his son's). advised regular sunscreen use. Denies worrisome, changing, or new skin lesions.  7. Smoking associated screening (lung cancer screening, AAA screen 65-75, UA)- never smoker 8. STD screening - only active with wife  Status of chronic or acute concerns   #Hernia surgery- done recently with Dr. Luisa Hart last Tuesday. Bruising has gone down some but has swelling around incision site. No longer needing miralax - Would look at this and appears to be healing well  #Coronary artery calcium-nonobstructive with CT calcium scoring November 2022 at 176 which was 65th percentile  #hyperlipidemia with LDL goal under 70 #aortic atherosclerosis  S: Medication:Rosuvastatin 10 mg with LDL before treatment at 110 -no chest pain or shortness of breath  Lab  Results  Component Value Date   CHOL 195 03/30/2022   HDL 58.20 03/30/2022   LDLCALC 111 (H) 03/30/2022   LDLDIRECT 59.0 11/06/2021   TRIG 128.0 03/30/2022   CHOLHDL 3 03/30/2022  A/P: lipids at goal last year- update today Aortic atherosclerosis (presumed stable)- LDL goal ideally <70-continue current medication as at goal Likely repeat scoring in 2027 and can reconsider cadiology depending on trajectory  #Borderline dilation of ascending aorta on CT calcium scoring at 39 mm but on repeat CT angiogram was not noted in 2023-no further follow-up   #Insomnia- Lunesta may give him 3 hours of sleep- uses sparingly but does help if needed- sleeping better  Recommended follow up: Return in about 1 year (around 05/25/2024) for physical or sooner if needed.Schedule b4 you leave.  Lab/Order associations:will come back fasting   ICD-10-CM   1. Preventative  health care  Z00.00     2. Hyperlipidemia, unspecified hyperlipidemia type  E78.5 Comprehensive metabolic panel    CBC with Differential/Platelet    Lipid panel    3. Screening for prostate cancer  Z12.5 PSA     No orders of the defined types were placed in this encounter.   Return precautions advised.  Tana Conch, MD

## 2023-05-27 ENCOUNTER — Other Ambulatory Visit (INDEPENDENT_AMBULATORY_CARE_PROVIDER_SITE_OTHER): Payer: BC Managed Care – PPO

## 2023-05-27 ENCOUNTER — Telehealth: Payer: Self-pay | Admitting: Family Medicine

## 2023-05-27 ENCOUNTER — Other Ambulatory Visit: Payer: Self-pay

## 2023-05-27 ENCOUNTER — Encounter: Payer: Self-pay | Admitting: Family Medicine

## 2023-05-27 DIAGNOSIS — R972 Elevated prostate specific antigen [PSA]: Secondary | ICD-10-CM

## 2023-05-27 DIAGNOSIS — E785 Hyperlipidemia, unspecified: Secondary | ICD-10-CM

## 2023-05-27 DIAGNOSIS — Z125 Encounter for screening for malignant neoplasm of prostate: Secondary | ICD-10-CM | POA: Diagnosis not present

## 2023-05-27 LAB — CBC WITH DIFFERENTIAL/PLATELET
Basophils Absolute: 0 10*3/uL (ref 0.0–0.1)
Basophils Relative: 0.5 % (ref 0.0–3.0)
Eosinophils Absolute: 0.1 10*3/uL (ref 0.0–0.7)
Eosinophils Relative: 1.9 % (ref 0.0–5.0)
HCT: 44.9 % (ref 39.0–52.0)
Hemoglobin: 15 g/dL (ref 13.0–17.0)
Lymphocytes Relative: 17.7 % (ref 12.0–46.0)
Lymphs Abs: 1.4 10*3/uL (ref 0.7–4.0)
MCHC: 33.4 g/dL (ref 30.0–36.0)
MCV: 92.9 fL (ref 78.0–100.0)
Monocytes Absolute: 0.7 10*3/uL (ref 0.1–1.0)
Monocytes Relative: 8.6 % (ref 3.0–12.0)
Neutro Abs: 5.4 10*3/uL (ref 1.4–7.7)
Neutrophils Relative %: 71.3 % (ref 43.0–77.0)
Platelets: 297 10*3/uL (ref 150.0–400.0)
RBC: 4.84 Mil/uL (ref 4.22–5.81)
RDW: 13.3 % (ref 11.5–15.5)
WBC: 7.6 10*3/uL (ref 4.0–10.5)

## 2023-05-27 LAB — PSA: PSA: 1.37 ng/mL (ref 0.10–4.00)

## 2023-05-27 LAB — COMPREHENSIVE METABOLIC PANEL
ALT: 22 U/L (ref 0–53)
AST: 19 U/L (ref 0–37)
Albumin: 4.2 g/dL (ref 3.5–5.2)
Alkaline Phosphatase: 59 U/L (ref 39–117)
BUN: 12 mg/dL (ref 6–23)
CO2: 28 meq/L (ref 19–32)
Calcium: 9.2 mg/dL (ref 8.4–10.5)
Chloride: 102 meq/L (ref 96–112)
Creatinine, Ser: 0.87 mg/dL (ref 0.40–1.50)
GFR: 89.09 mL/min (ref >60.00)
Glucose, Bld: 102 mg/dL — ABNORMAL HIGH (ref 70–99)
Potassium: 3.9 meq/L (ref 3.5–5.1)
Sodium: 138 meq/L (ref 135–145)
Total Bilirubin: 0.7 mg/dL (ref 0.2–1.2)
Total Protein: 6.6 g/dL (ref 6.0–8.3)

## 2023-05-27 LAB — LIPID PANEL
Cholesterol: 146 mg/dL (ref 0–200)
HDL: 56.4 mg/dL (ref >39.00)
LDL Cholesterol: 74 mg/dL (ref 0–99)
NonHDL: 89.93
Total CHOL/HDL Ratio: 3
Triglycerides: 82 mg/dL (ref 0.0–149.0)
VLDL: 16.4 mg/dL (ref 0.0–40.0)

## 2023-05-27 NOTE — Telephone Encounter (Signed)
Patient dropped off document  Physician results form , to be filled out by provider. Patient requested to send it back via Call Patient to pick up within 5-days. Document is located in providers tray at front office.Please advise at Mobile [phone] (mobile)

## 2023-05-27 NOTE — Telephone Encounter (Signed)
Form completed and faxed, copy mailed to pt per pt request.

## 2023-05-27 NOTE — Telephone Encounter (Signed)
Form in my "cpe folder" awaiting lab results before I can complete.

## 2023-06-13 ENCOUNTER — Other Ambulatory Visit: Payer: BC Managed Care – PPO

## 2023-06-13 ENCOUNTER — Encounter: Payer: Self-pay | Admitting: Family Medicine

## 2023-06-13 ENCOUNTER — Ambulatory Visit: Payer: BC Managed Care – PPO | Admitting: Family Medicine

## 2023-06-13 DIAGNOSIS — R972 Elevated prostate specific antigen [PSA]: Secondary | ICD-10-CM

## 2023-06-13 LAB — PSA: PSA: 1.04 ng/mL (ref 0.10–4.00)

## 2023-06-29 ENCOUNTER — Other Ambulatory Visit: Payer: Self-pay | Admitting: Family Medicine

## 2023-07-22 ENCOUNTER — Other Ambulatory Visit: Payer: Self-pay | Admitting: Family Medicine

## 2023-09-20 ENCOUNTER — Encounter: Payer: BC Managed Care – PPO | Admitting: Family Medicine

## 2023-12-29 ENCOUNTER — Other Ambulatory Visit: Payer: Self-pay | Admitting: Family Medicine

## 2024-02-01 ENCOUNTER — Encounter: Payer: BC Managed Care – PPO | Admitting: Family Medicine

## 2024-02-29 ENCOUNTER — Other Ambulatory Visit: Payer: Self-pay | Admitting: Family Medicine

## 2024-05-28 ENCOUNTER — Encounter: Payer: BC Managed Care – PPO | Admitting: Family Medicine

## 2024-06-06 ENCOUNTER — Encounter: Payer: Self-pay | Admitting: Family Medicine

## 2024-07-26 ENCOUNTER — Encounter: Admitting: Family Medicine
